# Patient Record
Sex: Male | Born: 1985 | Race: White | Hispanic: No | Marital: Married | Smoking: Former smoker
Health system: Southern US, Community
[De-identification: ages and names within clinical notes are randomized; demographics above are authoritative.]

## PROBLEM LIST (undated history)

## (undated) DIAGNOSIS — R011 Cardiac murmur, unspecified: Secondary | ICD-10-CM

## (undated) HISTORY — PX: HERNIA REPAIR: SHX51

## (undated) HISTORY — PX: CIRCUMCISION: SUR203

---
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
## 2006-04-12 ENCOUNTER — Emergency Department (HOSPITAL_COMMUNITY): Admission: EM | Admit: 2006-04-12 | Discharge: 2006-04-12 | Payer: Self-pay | Admitting: Emergency Medicine

## 2006-04-26 ENCOUNTER — Emergency Department (HOSPITAL_COMMUNITY): Admission: EM | Admit: 2006-04-26 | Discharge: 2006-04-26 | Payer: Self-pay | Admitting: Emergency Medicine

## 2007-09-12 ENCOUNTER — Observation Stay (HOSPITAL_COMMUNITY)
Admission: AD | Admit: 2007-09-12 | Discharge: 2007-09-13 | Payer: Self-pay | Admitting: Thoracic Surgery (Cardiothoracic Vascular Surgery)

## 2007-09-12 ENCOUNTER — Encounter: Payer: Self-pay | Admitting: Emergency Medicine

## 2007-09-12 ENCOUNTER — Ambulatory Visit: Payer: Self-pay | Admitting: Thoracic Surgery (Cardiothoracic Vascular Surgery)

## 2007-09-19 ENCOUNTER — Encounter
Admission: RE | Admit: 2007-09-19 | Discharge: 2007-09-19 | Payer: Self-pay | Admitting: Thoracic Surgery (Cardiothoracic Vascular Surgery)

## 2007-09-19 ENCOUNTER — Ambulatory Visit: Payer: Self-pay | Admitting: Thoracic Surgery (Cardiothoracic Vascular Surgery)

## 2007-12-14 ENCOUNTER — Emergency Department (HOSPITAL_BASED_OUTPATIENT_CLINIC_OR_DEPARTMENT_OTHER): Admission: EM | Admit: 2007-12-14 | Discharge: 2007-12-15 | Payer: Self-pay | Admitting: Emergency Medicine

## 2009-12-08 ENCOUNTER — Emergency Department (HOSPITAL_BASED_OUTPATIENT_CLINIC_OR_DEPARTMENT_OTHER): Admission: EM | Admit: 2009-12-08 | Discharge: 2009-12-08 | Payer: Self-pay | Admitting: Emergency Medicine

## 2009-12-08 ENCOUNTER — Ambulatory Visit: Payer: Self-pay | Admitting: Diagnostic Radiology

## 2009-12-16 ENCOUNTER — Emergency Department (HOSPITAL_BASED_OUTPATIENT_CLINIC_OR_DEPARTMENT_OTHER): Admission: EM | Admit: 2009-12-16 | Discharge: 2009-12-16 | Payer: Self-pay | Admitting: Emergency Medicine

## 2010-01-10 ENCOUNTER — Emergency Department (HOSPITAL_BASED_OUTPATIENT_CLINIC_OR_DEPARTMENT_OTHER): Admission: EM | Admit: 2010-01-10 | Discharge: 2010-01-10 | Payer: Self-pay | Admitting: Emergency Medicine

## 2010-01-21 ENCOUNTER — Emergency Department (HOSPITAL_BASED_OUTPATIENT_CLINIC_OR_DEPARTMENT_OTHER): Admission: EM | Admit: 2010-01-21 | Discharge: 2010-01-21 | Payer: Self-pay | Admitting: Emergency Medicine

## 2010-04-17 ENCOUNTER — Ambulatory Visit
Admission: RE | Admit: 2010-04-17 | Discharge: 2010-04-17 | Payer: Self-pay | Source: Home / Self Care | Attending: Family Medicine | Admitting: Family Medicine

## 2010-07-28 ENCOUNTER — Emergency Department (HOSPITAL_COMMUNITY)
Admission: EM | Admit: 2010-07-28 | Discharge: 2010-07-29 | Disposition: A | Discharge status: Home / Self Care | Payer: Self-pay | Attending: Emergency Medicine | Admitting: Emergency Medicine

## 2010-07-28 DIAGNOSIS — M545 Low back pain, unspecified: Secondary | ICD-10-CM | POA: Insufficient documentation

## 2010-07-28 DIAGNOSIS — IMO0002 Reserved for concepts with insufficient information to code with codable children: Secondary | ICD-10-CM | POA: Insufficient documentation

## 2010-07-29 ENCOUNTER — Telehealth: Payer: Self-pay

## 2010-07-29 NOTE — Telephone Encounter (Signed)
Message copied by Winfield Rast on Tue Jul 29, 2010  1:25 PM ------      Message from: Sharlot Gowda      Created: Tue Jul 29, 2010 12:46 PM       Have patient follow up with me

## 2010-07-29 NOTE — Telephone Encounter (Signed)
Called pt told pt to call back and set up appt to follow up with Dr. Susann Givens

## 2010-07-31 ENCOUNTER — Ambulatory Visit: Payer: Self-pay | Admitting: Family Medicine

## 2010-08-05 NOTE — H&P (Signed)
NAME:  Ricky Robertson, Ricky Robertson                ACCOUNT NO.:  [id number]   MEDICAL RECORD NO.:  [id number]          PATIENT TYPE:  INP   LOCATION:  2014                         FACILITY:  [facility]   PHYSICIAN:  Salvatore Decent. Cornelius Moras, M.D. DATE OF BIRTH:  1985-07-10   DATE OF ADMISSION:  09/12/2007  DATE OF DISCHARGE:                              HISTORY & PHYSICAL   HISTORY OF PRESENT ILLNESS:  This is a previously healthy 25 year old  Caucasian male who was transferred from [facility] to [facility] with  the complaint of shortness of breath over the last couple of weeks.  Yesterday, he was at a softball game and he slid hard into a second  base.  He then experienced a sudden onset of pain that included the  anterior chest area, bilateral neck, and seem to worsen with deep  inspiration.  The patient denies any history of tobacco use.  He has  never had a previous spontaneous pneumothorax or any other history of  trauma.  The patient had both chest x-ray and CT of the chest done at  [facility].  Chest x-ray revealed a tiny right apical pneumothorax with  pneumomediastinum with air dissecting up in the neck. CT of the chest  revealed pneumomediastinum with the air dissecting up in the neck,  however, no pneumothorax.  The patient remained hemodynamically stable  and as previously stated was transferred from [facility] to [facility].   PAST MEDICAL HISTORY:  Negative except the patient was told he had a  heart murmur.   PAST SURGICAL HISTORY:  Denies.   ALLERGIES:  No known drug allergies.   MEDICATIONS:  Database, denies.   FAMILY HISTORY:  Mother and father alive.  Father has a history of OSA.   REVIEW OF SYMPTOMS:  As stated in the HPI.  Dyspnea.  Denies any  hemoptysis, cough, fever, chills, or night sweats.  Denies any abdominal  pain, nauseous, vomiting, emesis, melena, or hematochezia.  MUSCULOSKELETAL:  Positive for bilateral neck pain.  NEURO:  Denies any  blurry or double vision.   Remaining review of symptoms noncontributory.   PHYSICAL EXAMINATION:  GENERAL:  This is a pleasant 25 year old  Caucasian male in no acute distress, is alert, oriented, and  cooperative.  VITAL SIGNS:  The patient is afebrile.  Heart rate is in 60s to 70s.  Blood pressure is 134/70.  HEENT:  Head is atraumatic and normocephalic.  Eyes:  Extraocular muscle  intact.  Pupils equal, round, and reactive to light and accommodation.  Sclerae nonicteric.  NECK:  Supple.  No JVD.  No subcutaneous emphysema.  CARDIOVASCULAR:  Regular rate and rhythm.  S1 and S2 without any  murmurs, gallops, or rubs.  LUNGS:  Clear to auscultation bilaterally.  No rales, wheezes, or  rhonchi.  ABDOMEN:  Soft and nontender.  Bowel sounds present throughout.  No  rebound or guarding.  No CVA tenderness.  EXTREMITIES:  No cyanosis, clubbing, or edema.  Palpable dorsalis pedis  and posterior tibialis bilaterally.  NEUROLOGICAL:  Cranial nerves II through XII grossly intact without any  focal deficits.  PERTINENT IMAGING STUDIES:  Have already been discussed in the HPI.   PERTINENT LABORATORY STUDIES:  White count 14,300, H and H 14.4 and 42.3  respectively, and platelet count of 172,000.  Chem-8, potassium 3.6, BUN  and creatinine of 15 and 1.2 respectively.   IMPRESSION AND PLAN:  Traumatic pneumomediastinum.  The patient will be  admitted for observation.  A repeat chest x-ray will be obtained in the  a.m.      Doree Fudge, PA      [place] H. Cornelius Moras, M.D.  Electronically Signed    DZ/MEDQ  D:  09/12/2007  T:  09/12/2007  Job:  [id number]

## 2010-08-05 NOTE — Assessment & Plan Note (Signed)
OFFICE VISIT   Ricky Robertson, Ricky Robertson  DOB:  March 03, 1986                                        September 19, 2007  CHART #:  [id number]   HISTORY:  The patient is seen in office visit followup following a  recent hospitalization for a traumatic pneumomediastinum.  On the day  prior to his admission, while playing a softball, he slid hard into  second base.  He developed a sudden onset of pain that included the  anterior chest, bilateral neck, and seemed to be worse with deep  inspiration.  He presented to the Emergency Department where he  underwent a chest x-ray and CT scan of the chest.  This revealed a tiny  right apical pneumothorax with pneumomediastinum with air dissecting up  in to the neck.  The CT of the chest revealed pneumomediastinum with the  air dissecting up to the neck, however, no pneumothorax.  He remained  hemodynamically stable and was admitted for observation overnight.  He  was discharged with improvement in the overall chest x-ray appearance as  well as his symptoms.  On today's date, he reports he continues to make  good progress.  He has occasional mild dysphagia, occasional mild  shortness of breath.   CHEST X-RAY:  Chest x-ray was reviewed on today's date.  It reveals no  further evidence of pneumomediastinum or pneumothorax.  There are no new  findings.   PHYSICAL EXAMINATION:  VITAL SIGNS:  Blood pressure is 144/74, pulse is  53, respirations 18, and oxygen saturation is 98%.  GENERAL APPEARANCE:  This is a well-developed young adult male, in no  acute distress.  PULMONARY:  Reveals clear breath sounds throughout.  CARDIAC:  Regular rate and rhythm.  Normal S1 and S2 without murmurs,  gallops, or rubs.   ASSESSMENT:  The patient has made good progress following his traumatic  pneumomediastinum.  There are no new surgical issues related to this.  He is encouraged to follow up with his family physician as related to  some previous issues  that he has questioned,  specifically the possibility that he may have some sleep apnea.  He  understands that he will no longer need to be followed from a thoracic  surgical view point.   Rowe Clack, P.A.-C.   Sherryll Burger  D:  09/19/2007  T:  09/19/2007  Job:  [id number]   cc:   Salvatore Decent. Cornelius Moras, M.D.

## 2010-08-08 NOTE — Discharge Summary (Signed)
NAME:  Ricky Robertson, Ricky Robertson                ACCOUNT NO.:  [id number]   MEDICAL RECORD NO.:  [id number]          PATIENT TYPE:  OBV   LOCATION:  2014                         FACILITY:  [facility]   PHYSICIAN:  Salvatore Decent. Cornelius Moras, M.D. DATE OF BIRTH:  [birth year]   DATE OF ADMISSION:  09/12/2007  DATE OF DISCHARGE:  09/13/2007                               DISCHARGE SUMMARY   Mr. Ricky Robertson is a 25 year old Caucasian male who was transferred from  [facility] to [facility] with complaints of shortness of  breath over the previous couple weeks.  On the day prior to admission at  a softball game, he slipped hard into second base.  He then experienced  a sudden onset of pain included his anterior chest, bilateral neck, and  seemed to worse with deep inspiration.  The patient denied any history  of tobacco use.  The patient never had a history of spontaneous  pneumothorax or other history of trauma.  The patient had both the chest  x-ray and a CT scan at [facility].  The chest x-ray revealed a tiny  right apical pneumothorax with pneumomediastinum with air dissecting up  to the neck.  CT of the chest revealed pneumomediastinum with the air  dissecting up to the neck; however, no pneumothorax.  The patient was  hemodynamically stable and transferred to [facility].   PAST MEDICAL HISTORY:  Negative except he was told he had a heart  murmur.   PAST SURGICAL HISTORY:  Unremarkable.   ALLERGIES:  No known.   MEDICATIONS:  Prior to admission, none.   FAMILY HISTORY:  Please see the history and physical done at the time of  admission.   SOCIAL HISTORY:  Please see the history and physical done at the time of  admission.   REVIEW OF SYSTEMS:  Please see the history and physical done at the time  of admission.   PHYSICAL EXAMINATION:  Please see the history and physical done at the  time of admission.   HOSPITAL COURSE:  The patient was admitted with traumatic  pneumomediastinum.  He was  admitted for overnight observation.  Repeat  chest x-ray showed some improvement with no air in the neck on September 13, 2007.  Additionally, he had a Gastrografin swallow.  This revealed only  a small hiatal hernia with no esophageal leakage.  He was deemed to be  acceptable for discharge on that date as he was otherwise quite stable.   MEDICATIONS ON DISCHARGE:  None.   INSTRUCTIONS:  The patient received written instructions regarding  medications, activity, diet, wound care, and followup.  Followup include  Dr. Cornelius Moras on 1 week post-discharge.   MEDICATIONS ON DISCHARGE:  Tylenol one every 4-6 hours as needed for  pain.  Of note, the patient was also instructed to follow up with his  family physician about any questionable history of sleep apnea.  He  understood this.   FINAL DIAGNOSIS:  Traumatic pneumomediastinum.   OTHER DIAGNOSIS:  Possible history of sleep apnea.      Rowe Clack, P.A.-C.  Salvatore Decent. Cornelius Moras, M.D.  Electronically Signed    WEG/MEDQ  D:  11/17/2007  T:  11/18/2007  Job:  [id number]

## 2010-09-18 ENCOUNTER — Emergency Department (HOSPITAL_COMMUNITY): Payer: Self-pay

## 2010-09-18 ENCOUNTER — Emergency Department (HOSPITAL_COMMUNITY)
Admission: EM | Admit: 2010-09-18 | Discharge: 2010-09-18 | Disposition: A | Discharge status: Home / Self Care | Payer: Self-pay | Attending: Emergency Medicine | Admitting: Emergency Medicine

## 2010-09-18 DIAGNOSIS — R51 Headache: Secondary | ICD-10-CM | POA: Insufficient documentation

## 2010-09-18 DIAGNOSIS — H5710 Ocular pain, unspecified eye: Secondary | ICD-10-CM | POA: Insufficient documentation

## 2010-09-18 DIAGNOSIS — S0510XA Contusion of eyeball and orbital tissues, unspecified eye, initial encounter: Secondary | ICD-10-CM | POA: Insufficient documentation

## 2010-09-18 DIAGNOSIS — R6884 Jaw pain: Secondary | ICD-10-CM | POA: Insufficient documentation

## 2010-09-18 DIAGNOSIS — S0083XA Contusion of other part of head, initial encounter: Secondary | ICD-10-CM | POA: Insufficient documentation

## 2010-09-18 DIAGNOSIS — S0280XA Fracture of other specified skull and facial bones, unspecified side, initial encounter for closed fracture: Secondary | ICD-10-CM | POA: Insufficient documentation

## 2010-09-18 DIAGNOSIS — S0003XA Contusion of scalp, initial encounter: Secondary | ICD-10-CM | POA: Insufficient documentation

## 2010-09-18 DIAGNOSIS — IMO0002 Reserved for concepts with insufficient information to code with codable children: Secondary | ICD-10-CM | POA: Insufficient documentation

## 2010-12-18 LAB — DIFFERENTIAL
Basophils Absolute: 0
Basophils Relative: 0
Eosinophils Absolute: 0.3
Monocytes Relative: 10
Neutro Abs: 9 — ABNORMAL HIGH
Neutrophils Relative %: 63

## 2010-12-18 LAB — CBC
MCHC: 34
MCV: 90
Platelets: 172
RBC: 4.7
WBC: 14.3 — ABNORMAL HIGH

## 2010-12-18 LAB — POCT I-STAT, CHEM 8
Chloride: 105
Glucose, Bld: 83
HCT: 44
Hemoglobin: 15
Potassium: 3.6
Sodium: 140

## 2010-12-22 LAB — URINALYSIS, ROUTINE W REFLEX MICROSCOPIC
Leukocytes, UA: NEGATIVE
Nitrite: NEGATIVE
Specific Gravity, Urine: 1.04 — ABNORMAL HIGH
Urobilinogen, UA: 1
pH: 6

## 2010-12-22 LAB — URINE MICROSCOPIC-ADD ON

## 2011-01-25 ENCOUNTER — Emergency Department (HOSPITAL_BASED_OUTPATIENT_CLINIC_OR_DEPARTMENT_OTHER)
Admission: EM | Admit: 2011-01-25 | Discharge: 2011-01-25 | Disposition: A | Discharge status: Home / Self Care | Payer: Self-pay | Attending: Emergency Medicine | Admitting: Emergency Medicine

## 2011-01-25 ENCOUNTER — Encounter: Payer: Self-pay | Admitting: *Deleted

## 2011-01-25 DIAGNOSIS — L0201 Cutaneous abscess of face: Secondary | ICD-10-CM | POA: Insufficient documentation

## 2011-01-25 DIAGNOSIS — F17200 Nicotine dependence, unspecified, uncomplicated: Secondary | ICD-10-CM | POA: Insufficient documentation

## 2011-01-25 DIAGNOSIS — L03211 Cellulitis of face: Secondary | ICD-10-CM | POA: Insufficient documentation

## 2011-01-25 HISTORY — DX: Cardiac murmur, unspecified: R01.1

## 2011-01-25 MED ORDER — LIDOCAINE HCL 2 % IJ SOLN
INTRAMUSCULAR | Status: AC
  Filled 2011-01-25: qty 1

## 2011-01-25 MED ORDER — HYDROCODONE-ACETAMINOPHEN 5-325 MG PO TABS: 2.0000 | ORAL_TABLET | ORAL | Status: AC | PRN

## 2011-01-25 MED ORDER — DOXYCYCLINE HYCLATE 100 MG PO CAPS: 100.0000 mg | ORAL_CAPSULE | Freq: Two times a day (BID) | ORAL | Status: AC

## 2011-01-25 MED ORDER — LIDOCAINE HCL 2 % IJ SOLN
10.0000 mL | Freq: Once | INTRAMUSCULAR | Status: AC
  Administered 2011-01-25: 200 mg via INTRADERMAL

## 2011-01-25 MED ORDER — LIDOCAINE HCL 2 % IJ SOLN: 20.0000 mL | Freq: Once | INTRAMUSCULAR | Status: DC

## 2011-01-25 NOTE — ED Notes (Signed)
While assessing pt, he was noted to wheeze when taking a deep breath. When questioned, he stated that is why he is going to quit smoking.

## 2011-01-25 NOTE — ED Notes (Signed)
Pt states he has had this knot on the side of his face for about 4 years. It is normally 1/4 " in diameter, but on Halloween he noticed it started getting bigger. No pain at present.

## 2011-01-25 NOTE — ED Provider Notes (Signed)
History     CSN: [id number] Arrival date & time: 01/25/2011  8:14 PM   First MD Initiated Contact with Patient 01/25/11 2029      Chief Complaint  Patient presents with  . Cyst    (Consider location/radiation/quality/duration/timing/severity/associated sxs/prior treatment) Patient is a 25 y.o. male presenting with abscess. The history is provided by the patient. No language interpreter was used.  Abscess  This is a new problem. The current episode started more than one week ago. The problem occurs frequently. The problem has been gradually worsening. The abscess is present on the face. The abscess is characterized by painfulness and redness. The abscess first occurred at home. There were no sick contacts. He has received no recent medical care.  Pt reports he has had a swollen area on the right side of his face for about 4 years.  Pt reports area was the size of a pea,  Now increased swelling and pain  Past Medical History  Diagnosis Date  . Heart murmur     Past Surgical History  Procedure Date  . Circumcision   . Hernia repair     History reviewed. No pertinent family history.  History  Substance Use Topics  . Smoking status: Current Some Day Smoker  . Smokeless tobacco: Not on file  . Alcohol Use: Yes      Review of Systems  HENT: Positive for facial swelling.   All other systems reviewed and are negative.    Allergies  Review of patient's allergies indicates no known allergies.  Home Medications  No current outpatient prescriptions on file.  BP 143/79  Pulse 60  Temp(Src) 98.2 F (36.8 C) (Oral)  Resp 18  Ht 5\' 11"  (1.803 m)  Wt 200 lb (90.719 kg)  BMI 27.89 kg/m2  SpO2 98%  Physical Exam  Nursing note and vitals reviewed. Constitutional: He appears well-developed and well-nourished.  HENT:  Head: Normocephalic and atraumatic.  Eyes: Conjunctivae and EOM are normal. Pupils are equal, round, and reactive to light.  Neck: Normal range of motion.  Neck supple.  Cardiovascular: Normal rate.   Pulmonary/Chest: Effort normal.  Musculoskeletal: Normal range of motion.  Neurological: He is alert.  Skin: There is erythema.       2 cm erythematous area fluctuant  Psychiatric: He has a normal mood and affect.    ED Course  INCISION AND DRAINAGE Date/Time: 01/25/2011 9:25 PM Performed by: Langston Masker Authorized by: Langston Masker Consent: Verbal consent obtained. Risks and benefits: risks, benefits and alternatives were discussed Consent given by: patient Patient understanding: patient states understanding of the procedure being performed Patient identity confirmed: verbally with patient Time out: Immediately prior to procedure a "time out" was called to verify the correct patient, procedure, equipment, support staff and site/side marked as required. Type: abscess Body area: head/neck Location details: face Anesthesia: local infiltration Local anesthetic: lidocaine 2% without epinephrine Anesthetic total: 3 ml Scalpel size: 11 Incision type: single straight Complexity: simple Drainage: purulent Drainage amount: moderate Wound treatment: wound left open Patient tolerance: Patient tolerated the procedure well with no immediate complications. Comments: Cyst material expressed,  Purulent drainage,     (including critical care time)   Labs Reviewed  CULTURE, ROUTINE-ABSCESS   No results found.   No diagnosis found.    MDM          Langston Masker, [place] 01/25/11 2126

## 2011-01-26 NOTE — ED Provider Notes (Signed)
Medical screening examination/treatment/procedure(s) were performed by non-physician practitioner and as supervising physician I was immediately available for consultation/collaboration.   Orlandria Kissner T Jocelyn Nold, MD 01/26/11 0543 

## 2011-01-28 LAB — CULTURE, ROUTINE-ABSCESS

## 2011-01-29 NOTE — ED Notes (Signed)
+   wound culture. Chart sent to EDP office for review 

## 2011-02-03 NOTE — ED Notes (Signed)
No further antibiotics necessary.Follow up as previously treated.

## 2011-02-05 NOTE — ED Notes (Signed)
Per Trixie Dredge.

## 2012-03-17 ENCOUNTER — Encounter (HOSPITAL_COMMUNITY): Payer: Self-pay | Admitting: *Deleted

## 2012-03-17 ENCOUNTER — Emergency Department (HOSPITAL_COMMUNITY)
Admission: EM | Admit: 2012-03-17 | Discharge: 2012-03-18 | Disposition: A | Discharge status: Home / Self Care | Payer: BC Managed Care – PPO | Attending: Emergency Medicine | Admitting: Emergency Medicine

## 2012-03-17 DIAGNOSIS — F17200 Nicotine dependence, unspecified, uncomplicated: Secondary | ICD-10-CM | POA: Insufficient documentation

## 2012-03-17 DIAGNOSIS — R011 Cardiac murmur, unspecified: Secondary | ICD-10-CM | POA: Insufficient documentation

## 2012-03-17 DIAGNOSIS — K2970 Gastritis, unspecified, without bleeding: Secondary | ICD-10-CM | POA: Insufficient documentation

## 2012-03-17 DIAGNOSIS — K2990 Gastroduodenitis, unspecified, without bleeding: Secondary | ICD-10-CM | POA: Insufficient documentation

## 2012-03-17 DIAGNOSIS — Z79899 Other long term (current) drug therapy: Secondary | ICD-10-CM | POA: Insufficient documentation

## 2012-03-17 DIAGNOSIS — R109 Unspecified abdominal pain: Secondary | ICD-10-CM

## 2012-03-17 DIAGNOSIS — R1084 Generalized abdominal pain: Secondary | ICD-10-CM | POA: Insufficient documentation

## 2012-03-17 LAB — CBC WITH DIFFERENTIAL/PLATELET
Basophils Relative: 0 % (ref 0–1)
Eosinophils Absolute: 0.2 10*3/uL (ref 0.0–0.7)
Lymphs Abs: 3 10*3/uL (ref 0.7–4.0)
MCH: 30.1 pg (ref 26.0–34.0)
MCHC: 34.7 g/dL (ref 30.0–36.0)
Neutro Abs: 6.8 10*3/uL (ref 1.7–7.7)
Neutrophils Relative %: 62 % (ref 43–77)
Platelets: 186 10*3/uL (ref 150–400)
RBC: 5.11 MIL/uL (ref 4.22–5.81)

## 2012-03-17 LAB — URINALYSIS, ROUTINE W REFLEX MICROSCOPIC
Bilirubin Urine: NEGATIVE
Leukocytes, UA: NEGATIVE
Nitrite: NEGATIVE
Specific Gravity, Urine: 1.02 (ref 1.005–1.030)
Urobilinogen, UA: 1 mg/dL (ref 0.0–1.0)
pH: 6 (ref 5.0–8.0)

## 2012-03-17 LAB — COMPREHENSIVE METABOLIC PANEL
ALT: 22 U/L (ref 0–53)
AST: 19 U/L (ref 0–37)
Albumin: 4.2 g/dL (ref 3.5–5.2)
Alkaline Phosphatase: 76 U/L (ref 39–117)
Chloride: 98 mEq/L (ref 96–112)
Potassium: 4 mEq/L (ref 3.5–5.1)
Sodium: 134 mEq/L — ABNORMAL LOW (ref 135–145)
Total Protein: 7.6 g/dL (ref 6.0–8.3)

## 2012-03-17 MED ORDER — SODIUM CHLORIDE 0.9 % IV SOLN
1000.0000 mL | Freq: Once | INTRAVENOUS | Status: AC
  Administered 2012-03-17: 1000 mL via INTRAVENOUS

## 2012-03-17 MED ORDER — SODIUM CHLORIDE 0.9 % IV SOLN
1000.0000 mL | INTRAVENOUS | Status: DC
  Administered 2012-03-18: 1000 mL via INTRAVENOUS

## 2012-03-17 MED ORDER — ONDANSETRON HCL 4 MG/2ML IJ SOLN
4.0000 mg | Freq: Once | INTRAMUSCULAR | Status: AC
  Administered 2012-03-17: 4 mg via INTRAVENOUS
  Filled 2012-03-17: qty 2

## 2012-03-17 MED ORDER — FAMOTIDINE IN NACL 20-0.9 MG/50ML-% IV SOLN
20.0000 mg | Freq: Once | INTRAVENOUS | Status: AC
  Administered 2012-03-17: 20 mg via INTRAVENOUS
  Filled 2012-03-17: qty 50

## 2012-03-17 MED ORDER — MORPHINE SULFATE 4 MG/ML IJ SOLN
4.0000 mg | Freq: Once | INTRAMUSCULAR | Status: AC
  Administered 2012-03-17: 4 mg via INTRAVENOUS
  Filled 2012-03-17: qty 1

## 2012-03-17 NOTE — ED Notes (Signed)
Pt c/o abd pain x 2 days; nausea no vomiting; diarrhea x 1; left flank pain

## 2012-03-18 ENCOUNTER — Emergency Department (HOSPITAL_COMMUNITY): Payer: BC Managed Care – PPO

## 2012-03-18 MED ORDER — OMEPRAZOLE 20 MG PO CPDR: DELAYED_RELEASE_CAPSULE | ORAL | Status: DC

## 2012-03-18 MED ORDER — IOHEXOL 300 MG/ML  SOLN
100.0000 mL | Freq: Once | INTRAMUSCULAR | Status: AC | PRN
  Administered 2012-03-18: 100 mL via INTRAVENOUS

## 2012-03-18 MED ORDER — MORPHINE SULFATE 4 MG/ML IJ SOLN
4.0000 mg | Freq: Once | INTRAMUSCULAR | Status: AC
  Administered 2012-03-18: 4 mg via INTRAVENOUS
  Filled 2012-03-18: qty 1

## 2012-03-18 MED ORDER — TRAMADOL-ACETAMINOPHEN 37.5-325 MG PO TABS: ORAL_TABLET | ORAL | Status: DC

## 2012-03-18 NOTE — ED Provider Notes (Signed)
History     CSN: [id number]  Arrival date & time 03/17/12  2117   First MD Initiated Contact with Patient 03/17/12 2304      No chief complaint on file.   (Consider location/radiation/quality/duration/timing/severity/associated sxs/prior treatment) HPI  Patient reports he started having diffuse abdominal pain 2 days ago that radiates into his lower back. He cannot characterize the pain except to say "it's a pain". At some point he does state it is a burning discomfort. He denies nausea, vomiting or fever. He did have diarrhea once today. He denies urinary symptoms such as dysuria or frequency. He states if he eats the pain gets worse and causes more burning in his abdomen. He states nothing makes the pain feel worse. He states the pain is constant but waxes and wanes. He denies taking any over-the-counter medications. He states he's never had this pain before and he's never had any abdominal surgery. He states today he was gagging trying to make himself vomit thinking that might make his pain better and he saw some streaks of blood twice in his saliva.  PCP Dr. Susann Givens  Past Medical History  Diagnosis Date  . Heart murmur     Past Surgical History  Procedure Date  . Circumcision   . Hernia repair     No family history on file.  History  Substance Use Topics  . Smoking status: Current Some Day Smoker 1 pack per day   . Smokeless tobacco: Not on file  . Alcohol Use: Yes  Patient had 2 beers Christmas Eve Employed    Review of Systems  All other systems reviewed and are negative.    Allergies  Review of patient's allergies indicates no known allergies.  Home Medications   Current Outpatient Rx  Name  Route  Sig  Dispense  Refill  . OMEPRAZOLE 20 MG PO CPDR      Take 1 or 2 po Q 6hrs for pain   60 capsule   0   . TRAMADOL-ACETAMINOPHEN 37.5-325 MG PO TABS      2 tabs po QID prn pain   16 tablet   0     BP 127/94  Pulse 64  Temp 97.9 F (36.6 C)  (Oral)  Resp 20  SpO2 98%  Vital signs normal    Physical Exam  Nursing note and vitals reviewed. Constitutional: He is oriented to person, place, and time. He appears well-developed and well-nourished.  Non-toxic appearance. He does not appear ill. No distress.  HENT:  Head: Normocephalic and atraumatic.  Right Ear: External ear normal.  Left Ear: External ear normal.  Nose: Nose normal. No mucosal edema or rhinorrhea.  Mouth/Throat: Oropharynx is clear and moist and mucous membranes are normal. No dental abscesses or uvula swelling.  Eyes: Conjunctivae normal and EOM are normal. Pupils are equal, round, and reactive to light.  Neck: Normal range of motion and full passive range of motion without pain. Neck supple.  Cardiovascular: Normal rate, regular rhythm and normal heart sounds.  Exam reveals no gallop and no friction rub.   No murmur heard. Pulmonary/Chest: Effort normal and breath sounds normal. No respiratory distress. He has no wheezes. He has no rhonchi. He has no rales. He exhibits no tenderness and no crepitus.  Abdominal: Soft. Normal appearance and bowel sounds are normal. He exhibits no distension. There is no tenderness. There is no rebound and no guarding.       Active bowel sounds Nontender to palpation he states that  the pain is deeper and is not made worse with direct palpation  Musculoskeletal: Normal range of motion. He exhibits no edema and no tenderness.       Moves all extremities well.   Patient indicates his back pain is in his lower right sacral area  Neurological: He is alert and oriented to person, place, and time. He has normal strength. No cranial nerve deficit.  Skin: Skin is warm, dry and intact. No rash noted. No erythema. No pallor.  Psychiatric: He has a normal mood and affect. His speech is normal and behavior is normal. His mood appears not anxious.    ED Course  Procedures (including critical care time)   Medications  0.9 %  sodium  chloride infusion (0 mL Intravenous Stopped 03/18/12 0056)    Followed by  0.9 %  sodium chloride infusion (1000 mL Intravenous New Bag/Given 03/18/12 0100)  morphine 4 MG/ML injection 4 mg (4 mg Intravenous Given 03/17/12 2357)  ondansetron (ZOFRAN) injection 4 mg (4 mg Intravenous Given 03/17/12 2357)  famotidine (PEPCID) IVPB 20 mg (0 mg Intravenous Stopped 03/18/12 0056)  morphine 4 MG/ML injection 4 mg (4 mg Intravenous Given 03/18/12 0059)  iohexol (OMNIPAQUE) 300 MG/ML solution 100 mL (100 mL Intravenous Contrast Given 03/18/12 0233)   Recheck 04:30 pt sleeping in NAD.   Results for orders placed during the hospital encounter of 03/17/12  URINALYSIS, ROUTINE W REFLEX MICROSCOPIC      Component Value Range   Color, Urine YELLOW  YELLOW   APPearance CLEAR  CLEAR   Specific Gravity, Urine 1.020  1.005 - 1.030   pH 6.0  5.0 - 8.0   Glucose, UA NEGATIVE  NEGATIVE mg/dL   Hgb urine dipstick NEGATIVE  NEGATIVE   Bilirubin Urine NEGATIVE  NEGATIVE   Ketones, ur NEGATIVE  NEGATIVE mg/dL   Protein, ur NEGATIVE  NEGATIVE mg/dL   Urobilinogen, UA 1.0  0.0 - 1.0 mg/dL   Nitrite NEGATIVE  NEGATIVE   Leukocytes, UA NEGATIVE  NEGATIVE  CBC WITH DIFFERENTIAL      Component Value Range   WBC 11.0 (*) 4.0 - 10.5 K/uL   RBC 5.11  4.22 - 5.81 MIL/uL   Hemoglobin 15.4  13.0 - 17.0 g/dL   HCT 16.1  09.6 - 04.5 %   MCV 86.9  78.0 - 100.0 fL   MCH 30.1  26.0 - 34.0 pg   MCHC 34.7  30.0 - 36.0 g/dL   RDW 40.9  81.1 - 91.4 %   Platelets 186  150 - 400 K/uL   Neutrophils Relative 62  43 - 77 %   Neutro Abs 6.8  1.7 - 7.7 K/uL   Lymphocytes Relative 27  12 - 46 %   Lymphs Abs 3.0  0.7 - 4.0 K/uL   Monocytes Relative 9  3 - 12 %   Monocytes Absolute 1.0  0.1 - 1.0 K/uL   Eosinophils Relative 2  0 - 5 %   Eosinophils Absolute 0.2  0.0 - 0.7 K/uL   Basophils Relative 0  0 - 1 %   Basophils Absolute 0.0  0.0 - 0.1 K/uL  COMPREHENSIVE METABOLIC PANEL      Component Value Range   Sodium 134 (*)  135 - 145 mEq/L   Potassium 4.0  3.5 - 5.1 mEq/L   Chloride 98  96 - 112 mEq/L   CO2 27  19 - 32 mEq/L   Glucose, Bld 98  70 - 99 mg/dL   BUN  16  6 - 23 mg/dL   Creatinine, Ser 1.61  0.50 - 1.35 mg/dL   Calcium 9.6  8.4 - 09.6 mg/dL   Total Protein 7.6  6.0 - 8.3 g/dL   Albumin 4.2  3.5 - 5.2 g/dL   AST 19  0 - 37 U/L   ALT 22  0 - 53 U/L   Alkaline Phosphatase 76  39 - 117 U/L   Total Bilirubin 0.6  0.3 - 1.2 mg/dL   GFR calc non Af Amer 71 (*) >90 mL/min   GFR calc Af Amer 82 (*) >90 mL/min  LIPASE, BLOOD      Component Value Range   Lipase 24  11 - 59 U/L   Laboratory interpretation all normal except except for leukocytosis    Ct Abdomen Pelvis W Contrast  03/18/2012  *RADIOLOGY REPORT*  Clinical Data: Left-sided flank pain, nausea and diarrhea.  Diffuse abdominal pain.  CT ABDOMEN AND PELVIS WITH CONTRAST  Technique:  Multidetector CT imaging of the abdomen and pelvis was performed following the standard protocol during bolus administration of intravenous contrast.  Contrast: OMNIPAQUE IOHEXOL 300 MG/ML  SOLN  Comparison: None.  Findings: The visualized lung bases are clear.  The liver and spleen are unremarkable in appearance.  The gallbladder is within normal limits.  The pancreas and adrenal glands are unremarkable.  The kidneys are unremarkable in appearance.  There is no evidence of hydronephrosis.  No renal or ureteral stones are seen.  No perinephric stranding is appreciated.  No free fluid is identified.  The small bowel is unremarkable in appearance.  The stomach is within normal limits.  No acute vascular abnormalities are seen.  The appendix is normal in caliber, without evidence for appendicitis.  The colon is unremarkable in appearance.  The bladder is mildly distended and grossly unremarkable.  The prostate remains normal in size.  No inguinal lymphadenopathy is seen.  No acute osseous abnormalities are identified.  Chronic bilateral pars defects are noted at L5,  without evidence of anterolisthesis.  IMPRESSION:  1.  No acute abnormalities seen within the abdomen or pelvis. 2.  Chronic bilateral pars defects at L5, without evidence of anterolisthesis.   Original Report Authenticated By: Tonia Ghent, M.D.      1. Abdominal pain   2. Gastritis    New Prescriptions   OMEPRAZOLE (PRILOSEC) 20 MG CAPSULE    Take 1 or 2 po Q 6hrs for pain   TRAMADOL-ACETAMINOPHEN (ULTRACET) 37.5-325 MG PER TABLET    2 tabs po QID prn pain    Plan discharge  Devoria Albe, MD, Armando Gang    MDM          Ward Givens, MD 03/18/12 [phone]

## 2012-09-29 ENCOUNTER — Emergency Department (HOSPITAL_BASED_OUTPATIENT_CLINIC_OR_DEPARTMENT_OTHER)
Admission: EM | Admit: 2012-09-29 | Discharge: 2012-09-29 | Disposition: A | Discharge status: Home / Self Care | Payer: BC Managed Care – PPO | Attending: Emergency Medicine | Admitting: Emergency Medicine

## 2012-09-29 ENCOUNTER — Emergency Department (HOSPITAL_BASED_OUTPATIENT_CLINIC_OR_DEPARTMENT_OTHER): Payer: BC Managed Care – PPO

## 2012-09-29 ENCOUNTER — Encounter (HOSPITAL_BASED_OUTPATIENT_CLINIC_OR_DEPARTMENT_OTHER): Payer: Self-pay | Admitting: *Deleted

## 2012-09-29 DIAGNOSIS — Y9364 Activity, baseball: Secondary | ICD-10-CM | POA: Insufficient documentation

## 2012-09-29 DIAGNOSIS — Y92838 Other recreation area as the place of occurrence of the external cause: Secondary | ICD-10-CM | POA: Insufficient documentation

## 2012-09-29 DIAGNOSIS — W1809XA Striking against other object with subsequent fall, initial encounter: Secondary | ICD-10-CM | POA: Insufficient documentation

## 2012-09-29 DIAGNOSIS — Z79899 Other long term (current) drug therapy: Secondary | ICD-10-CM | POA: Insufficient documentation

## 2012-09-29 DIAGNOSIS — R0602 Shortness of breath: Secondary | ICD-10-CM | POA: Insufficient documentation

## 2012-09-29 DIAGNOSIS — R011 Cardiac murmur, unspecified: Secondary | ICD-10-CM | POA: Insufficient documentation

## 2012-09-29 DIAGNOSIS — S2341XA Sprain of ribs, initial encounter: Secondary | ICD-10-CM

## 2012-09-29 DIAGNOSIS — R059 Cough, unspecified: Secondary | ICD-10-CM | POA: Insufficient documentation

## 2012-09-29 DIAGNOSIS — R05 Cough: Secondary | ICD-10-CM | POA: Insufficient documentation

## 2012-09-29 DIAGNOSIS — Y9239 Other specified sports and athletic area as the place of occurrence of the external cause: Secondary | ICD-10-CM | POA: Insufficient documentation

## 2012-09-29 DIAGNOSIS — F17200 Nicotine dependence, unspecified, uncomplicated: Secondary | ICD-10-CM | POA: Insufficient documentation

## 2012-09-29 MED ORDER — OXYCODONE-ACETAMINOPHEN 5-325 MG PO TABS: 1.0000 | ORAL_TABLET | ORAL | Status: DC | PRN

## 2012-09-29 NOTE — ED Notes (Signed)
Pt reports playing softball 6/30, dove for ball and landed on left side, pt experienced some pain at time, but has had increased sob since. Pt also reports that he has history of sob, uses son's nebulizer machine at home at times which helps. Pt reports pain now radiates from under left breast to back within last few days

## 2012-09-29 NOTE — ED Notes (Signed)
Pt c/o left rib injury x 5 days ago now c/o SOb and back pain

## 2012-09-30 NOTE — ED Provider Notes (Signed)
History    CSN: [id number] Arrival date & time 09/29/12  4098  First MD Initiated Contact with Patient 09/29/12 1933     Chief Complaint  Patient presents with  . Rib Injury    Patient is a 27 y.o. male presenting with chest pain. The history is provided by the patient.  Chest Pain Pain location:  L chest Pain radiates to:  Upper back Pain severity:  Moderate Duration:  11 days Timing:  Constant Progression:  Worsening Chronicity:  New Relieved by:  None tried Worsened by:  Deep breathing Associated symptoms: cough and shortness of breath   Associated symptoms: no fever and no weakness   pt reports he was playing softball 11 days ago and fell landing on left chest No LOC No head injury No HA No neck pain He reports he has had persistent pain in chest that radiates to back with mild SOB No hemoptysis  Past Medical History  Diagnosis Date  . Heart murmur    Past Surgical History  Procedure Laterality Date  . Circumcision    . Hernia repair     History reviewed. No pertinent family history. History  Substance Use Topics  . Smoking status: Current Some Day Smoker  . Smokeless tobacco: Not on file  . Alcohol Use: Yes    Review of Systems  Constitutional: Negative for fever.  HENT: Negative for neck pain and neck stiffness.   Respiratory: Positive for cough and shortness of breath.   Cardiovascular: Positive for chest pain.  Neurological: Negative for syncope and weakness.  All other systems reviewed and are negative.    Allergies  Review of patient's allergies indicates no known allergies.  Home Medications   Current Outpatient Rx  Name  Route  Sig  Dispense  Refill  . omeprazole (PRILOSEC) 20 MG capsule      Take 1 or 2 po Q 6hrs for pain   60 capsule   0   . oxyCODONE-acetaminophen (PERCOCET/ROXICET) 5-325 MG per tablet   Oral   Take 1 tablet by mouth every 4 (four) hours as needed for pain.   3 tablet   0    BP 137/71  Pulse 72   Temp(Src) 98.2 F (36.8 C) (Oral)  Resp 16  Ht 5\' 11"  (1.803 m)  Wt 210 lb (95.255 kg)  BMI 29.3 kg/m2  SpO2 98% Physical Exam CONSTITUTIONAL: Well developed/well nourished, watching TV HEAD: Normocephalic/atraumatic EYES: EOMI/PERRL ENMT: Mucous membranes moist NECK: supple no meningeal signs SPINE:entire spine nontender, No bruising/crepitance/stepoffs noted to spine CV: S1/S2 noted, no murmurs/rubs/gallops noted Chest - mild tenderness along left chest wall, no crepitance or stepoffs LUNGS: Lungs are clear to auscultation bilaterally, no apparent distress ABDOMEN: soft, nontender, no rebound or guarding GU:no cva tenderness NEURO: Pt is awake/alert, moves all extremitiesx4 EXTREMITIES: pulses normal, full ROM SKIN: warm, color normal PSYCH: no abnormalities of mood noted  ED Course  Procedures (including critical care time) Labs Reviewed - No data to display Dg Ribs Unilateral W/chest Left  09/29/2012   *RADIOLOGY REPORT*  Clinical Data: Posterior rib pain.Baseball injury.  LEFT RIBS AND CHEST - 3+ VIEW  Comparison: 12/14/2007  Findings: Heart and mediastinal contours are within normal limits. No focal opacities or effusions.  No acute bony abnormality.  No visible rib fracture.  No pneumothorax.  IMPRESSION: No active cardiopulmonary disease.   Original Report Authenticated By: Charlett Nose, M.D.   1. Sprain and strain of ribs, initial encounter     MDM  Nursing notes including past medical history and social history reviewed and considered in documentation xrays reviewed and considered   Xray negative Pt in no distress Pain only with palpation/movement, doubt ACS/PE or other cause given pain started after fall No hypoxia.  Stable for d/c  Joya Gaskins, MD 09/30/12 [phone]

## 2014-08-10 ENCOUNTER — Encounter (HOSPITAL_BASED_OUTPATIENT_CLINIC_OR_DEPARTMENT_OTHER): Payer: Self-pay

## 2014-08-10 ENCOUNTER — Emergency Department (HOSPITAL_BASED_OUTPATIENT_CLINIC_OR_DEPARTMENT_OTHER): Payer: BLUE CROSS/BLUE SHIELD

## 2014-08-10 ENCOUNTER — Emergency Department (HOSPITAL_BASED_OUTPATIENT_CLINIC_OR_DEPARTMENT_OTHER)
Admission: EM | Admit: 2014-08-10 | Discharge: 2014-08-10 | Disposition: A | Discharge status: Home / Self Care | Payer: BLUE CROSS/BLUE SHIELD | Attending: Emergency Medicine | Admitting: Emergency Medicine

## 2014-08-10 DIAGNOSIS — Z720 Tobacco use: Secondary | ICD-10-CM | POA: Insufficient documentation

## 2014-08-10 DIAGNOSIS — S9002XA Contusion of left ankle, initial encounter: Secondary | ICD-10-CM | POA: Diagnosis not present

## 2014-08-10 DIAGNOSIS — Y998 Other external cause status: Secondary | ICD-10-CM | POA: Insufficient documentation

## 2014-08-10 DIAGNOSIS — W208XXA Other cause of strike by thrown, projected or falling object, initial encounter: Secondary | ICD-10-CM | POA: Diagnosis not present

## 2014-08-10 DIAGNOSIS — Y9289 Other specified places as the place of occurrence of the external cause: Secondary | ICD-10-CM | POA: Insufficient documentation

## 2014-08-10 DIAGNOSIS — Z79899 Other long term (current) drug therapy: Secondary | ICD-10-CM | POA: Insufficient documentation

## 2014-08-10 DIAGNOSIS — R011 Cardiac murmur, unspecified: Secondary | ICD-10-CM | POA: Diagnosis not present

## 2014-08-10 DIAGNOSIS — S9001XA Contusion of right ankle, initial encounter: Secondary | ICD-10-CM

## 2014-08-10 DIAGNOSIS — S99912A Unspecified injury of left ankle, initial encounter: Secondary | ICD-10-CM | POA: Diagnosis present

## 2014-08-10 DIAGNOSIS — Y9389 Activity, other specified: Secondary | ICD-10-CM | POA: Insufficient documentation

## 2014-08-10 MED ORDER — IBUPROFEN 400 MG PO TABS
400.0000 mg | ORAL_TABLET | Freq: Once | ORAL | Status: AC
  Administered 2014-08-10: 400 mg via ORAL
  Filled 2014-08-10: qty 1

## 2014-08-10 MED ORDER — OXYCODONE-ACETAMINOPHEN 5-325 MG PO TABS
1.0000 | ORAL_TABLET | Freq: Once | ORAL | Status: AC
  Administered 2014-08-10: 1 via ORAL
  Filled 2014-08-10: qty 1

## 2014-08-10 MED ORDER — OXYCODONE-ACETAMINOPHEN 5-325 MG PO TABS: ORAL_TABLET | ORAL | Status: DC

## 2014-08-10 NOTE — Discharge Instructions (Signed)
Rest, Ice intermittently (in the first 24-48 hours), Gentle compression with an Ace wrap, and elevate (Limb above the level of the heart)   Take up to 800mg  of ibuprofen (that is usually 4 over the counter pills)  3 times a day for 5 days. Take with food.  Take percocet for breakthrough pain, do not drink alcohol, drive, care for children or do other critical tasks while taking percocet.  Please follow with your primary care doctor in the next 2 days for a check-up. They must obtain records for further management.   Do not hesitate to return to the Emergency Department for any new, worsening or concerning symptoms.    Contusion A contusion is a deep bruise. Contusions are the result of an injury that caused bleeding under the skin. The contusion may turn blue, purple, or yellow. Minor injuries will give you a painless contusion, but more severe contusions may stay painful and swollen for a few weeks.  CAUSES  A contusion is usually caused by a blow, trauma, or direct force to an area of the body. SYMPTOMS   Swelling and redness of the injured area.  Bruising of the injured area.  Tenderness and soreness of the injured area.  Pain. DIAGNOSIS  The diagnosis can be made by taking a history and physical exam. An X-ray, CT scan, or MRI may be needed to determine if there were any associated injuries, such as fractures. TREATMENT  Specific treatment will depend on what area of the body was injured. In general, the best treatment for a contusion is resting, icing, elevating, and applying cold compresses to the injured area. Over-the-counter medicines may also be recommended for pain control. Ask your caregiver what the best treatment is for your contusion. HOME CARE INSTRUCTIONS   Put ice on the injured area.  Put ice in a plastic bag.  Place a towel between your skin and the bag.  Leave the ice on for 15-20 minutes, 3-4 times a day, or as directed by your health care provider.  Only  take over-the-counter or prescription medicines for pain, discomfort, or fever as directed by your caregiver. Your caregiver may recommend avoiding anti-inflammatory medicines (aspirin, ibuprofen, and naproxen) for 48 hours because these medicines may increase bruising.  Rest the injured area.  If possible, elevate the injured area to reduce swelling. SEEK IMMEDIATE MEDICAL CARE IF:   You have increased bruising or swelling.  You have pain that is getting worse.  Your swelling or pain is not relieved with medicines. MAKE SURE YOU:   Understand these instructions.  Will watch your condition.  Will get help right away if you are not doing well or get worse. Document Released: 12/17/2004 Document Revised: 03/14/2013 Document Reviewed: 01/12/2011 [facility] Patient Information 2015 [place], [place]. This information is not intended to replace advice given to you by your health care provider. Make sure you discuss any questions you have with your health care provider.

## 2014-08-10 NOTE — ED Notes (Addendum)
Pt reports dropping a piece of steel onto his left ankle and left foot today. Laceration noted to area with edema noted. Pt states steel fell out of the back of his truck.

## 2014-08-10 NOTE — ED Notes (Signed)
Pt states a large piece of "steel" fell out of the bed of a truck, striking patient's left dorsal ankle, around 530pm tonight.  Pt denies taking any pain medication prior to ED visit today.  Ice pack applied.  Visible swelling and redness observed at site of impact on left ankle.

## 2014-08-10 NOTE — ED Provider Notes (Signed)
CSN: [id number]     Arrival date & time 08/10/14  1936 History   First MD Initiated Contact with Patient 08/10/14 1937     Chief Complaint  Patient presents with  . Ankle Injury     (Consider location/radiation/quality/duration/timing/severity/associated sxs/prior Treatment) HPI   Faustino CongressBrian K Barefield is a 29 y.o. male complaining of severe 9 out of 10 pain to dorsum of left ankle 5:30 PM, after a 40 pound piece of metal fell on the ankle. Patient has been using crutches, pain is described as sharp and exacerbated by weightbearing. Duration where there was contact with the piece of metal. Patient states his last tetanus shot was within the last year. No pain medication prior to arrival. Denies history of trauma or surgeries to the affected joints, numbness, weakness.   Past Medical History  Diagnosis Date  . Heart murmur    Past Surgical History  Procedure Laterality Date  . Circumcision    . Hernia repair     History reviewed. No pertinent family history. History  Substance Use Topics  . Smoking status: Current Some Day Smoker  . Smokeless tobacco: Not on file  . Alcohol Use: Yes    Review of Systems  10 systems reviewed and found to be negative, except as noted in the HPI.  Allergies  Review of patient's allergies indicates no known allergies.  Home Medications   Prior to Admission medications   Medication Sig Start Date End Date Taking? Authorizing Provider  omeprazole (PRILOSEC) 20 MG capsule Take 1 or 2 po Q 6hrs for pain 03/18/12   Devoria AlbeIva Knapp, MD  oxyCODONE-acetaminophen (PERCOCET/ROXICET) 5-325 MG per tablet 1 to 2 tabs PO q6hrs  PRN for pain 08/10/14   Joni ReiningNicole Coty Larsh, PA-C   BP 146/78 mmHg  Pulse 84  Temp(Src) 98.2 F (36.8 C) (Oral)  Resp 20  Ht 5\' 11"  (1.803 m)  Wt 220 lb (99.791 kg)  BMI 30.70 kg/m2  SpO2 100% Physical Exam  Constitutional: He is oriented to person, place, and time. He appears well-developed and well-nourished. No distress.  HENT:  Head:  Normocephalic.  Eyes: Conjunctivae and EOM are normal.  Cardiovascular: Normal rate.   Pulmonary/Chest: Effort normal. No stridor.  Musculoskeletal: Normal range of motion. He exhibits edema.  Deformity, 2 x 3 cm partial-thickness abrasion to lateral side of left ankle. Neurovascularly intact with diffuse tenderness to palpation along the lateral foot and malleolus.  Neurological: He is alert and oriented to person, place, and time.  Psychiatric: He has a normal mood and affect.  Nursing note and vitals reviewed.   ED Course  Procedures (including critical care time)\  SPLINT APPLICATION Date/Time: 8:24 PM Authorized by: Wynetta EmeryPISCIOTTA, Atasha Colebank Consent: Verbal consent obtained. Risks and benefits: risks, benefits and alternatives were discussed Consent given by: patient Splint applied by: EMT Location details: Left ankle  Splint type: Posterior short-leg splint  Supplies used: orthoglass Post-procedure: The splinted body part was neurovascularly unchanged following the procedure. Patient tolerance: Patient tolerated the procedure well with no immediate complications.    Labs Review Labs Reviewed - No data to display  Imaging Review Dg Ankle Complete Left  08/10/2014   CLINICAL DATA:  Dropped steel pipe on left ankle and foot, with abrasions and difficulty bearing weight. Initial encounter.  EXAM: LEFT ANKLE COMPLETE - 3+ VIEW  COMPARISON:  None.  FINDINGS: There appears to be a small avulsion fracture at the distal tip of the distal fibula. No additional fractures are seen. The ankle mortise is intact;  the interosseous space is within normal limits. No talar tilt or subluxation is seen.  The joint spaces are preserved. Mild dorsal soft tissue swelling is noted at the ankle.  IMPRESSION: Apparent small avulsion fracture at the distal tip of the distal fibula.   Electronically Signed   By: Roanna RaiderJeffery  Chang M.D.   On: 08/10/2014 20:01   Dg Foot Complete Left  08/10/2014   CLINICAL DATA:   Dropped a steel pipe on left foot and ankle. Abrasion. Difficulty bearing weight.  EXAM: LEFT FOOT - COMPLETE 3+ VIEW  COMPARISON:  None.  FINDINGS: There is no evidence of fracture or dislocation. There is no evidence of arthropathy or other focal bone abnormality. Soft tissues are unremarkable.  IMPRESSION: Negative.   Electronically Signed   By: Charlett NoseKevin  Dover M.D.   On: 08/10/2014 20:01     EKG Interpretation None      MDM   Final diagnoses:  Contusion of right ankle, initial encounter    Filed Vitals:   08/10/14 1939  BP: 146/78  Pulse: 84  Temp: 98.2 F (36.8 C)  TempSrc: Oral  Resp: 20  Height: 5\' 11"  (1.803 m)  Weight: 220 lb (99.791 kg)  SpO2: 100%    Medications  oxyCODONE-acetaminophen (PERCOCET/ROXICET) 5-325 MG per tablet 1 tablet (1 tablet Oral Given 08/10/14 2021)  ibuprofen (ADVIL,MOTRIN) tablet 400 mg (400 mg Oral Given 08/10/14 2021)    Faustino CongressBrian K Hathorne is a pleasant 29 y.o. male presenting with partial thickness abrasion and left foot and ankle pain after a 60 pound piece of metal fell on the foot at 5:30 PM tonight, tetanus is up-to-date. He is neurovascularly intact, he is non-ambulatory. X-ray shows a small avulsion fracture to the distal tip of the fibula. Patient will be placed in posterior short legs ankle splint, he has crutches. [facility]. Work note provided with limited activity.  Evaluation does not show pathology that would require ongoing emergent intervention or inpatient treatment. Pt is hemodynamically stable and mentating appropriately. Discussed findings and plan with patient/guardian, who agrees with care plan. All questions answered. Return precautions discussed and outpatient follow up given.   New Prescriptions   OXYCODONE-ACETAMINOPHEN (PERCOCET/ROXICET) 5-325 MG PER TABLET    1 to 2 tabs PO q6hrs  PRN for pain         Wynetta Emeryicole Monserratt Knezevic, PA-C 08/10/14 2025  Glynn OctaveStephen Rancour, MD 08/10/14 2200

## 2014-08-27 ENCOUNTER — Emergency Department (HOSPITAL_BASED_OUTPATIENT_CLINIC_OR_DEPARTMENT_OTHER)
Admission: EM | Admit: 2014-08-27 | Discharge: 2014-08-27 | Disposition: A | Discharge status: Home / Self Care | Payer: BLUE CROSS/BLUE SHIELD | Attending: Emergency Medicine | Admitting: Emergency Medicine

## 2014-08-27 ENCOUNTER — Encounter (HOSPITAL_BASED_OUTPATIENT_CLINIC_OR_DEPARTMENT_OTHER): Payer: Self-pay

## 2014-08-27 ENCOUNTER — Emergency Department (HOSPITAL_BASED_OUTPATIENT_CLINIC_OR_DEPARTMENT_OTHER): Payer: BLUE CROSS/BLUE SHIELD

## 2014-08-27 DIAGNOSIS — S63502A Unspecified sprain of left wrist, initial encounter: Secondary | ICD-10-CM | POA: Diagnosis not present

## 2014-08-27 DIAGNOSIS — Z720 Tobacco use: Secondary | ICD-10-CM | POA: Insufficient documentation

## 2014-08-27 DIAGNOSIS — Y9289 Other specified places as the place of occurrence of the external cause: Secondary | ICD-10-CM | POA: Diagnosis not present

## 2014-08-27 DIAGNOSIS — W271XXA Contact with garden tool, initial encounter: Secondary | ICD-10-CM | POA: Insufficient documentation

## 2014-08-27 DIAGNOSIS — Y9389 Activity, other specified: Secondary | ICD-10-CM | POA: Insufficient documentation

## 2014-08-27 DIAGNOSIS — R011 Cardiac murmur, unspecified: Secondary | ICD-10-CM | POA: Insufficient documentation

## 2014-08-27 DIAGNOSIS — S6992XA Unspecified injury of left wrist, hand and finger(s), initial encounter: Secondary | ICD-10-CM | POA: Diagnosis present

## 2014-08-27 DIAGNOSIS — Y998 Other external cause status: Secondary | ICD-10-CM | POA: Diagnosis not present

## 2014-08-27 MED ORDER — IBUPROFEN 600 MG PO TABS: 600.0000 mg | ORAL_TABLET | Freq: Four times a day (QID) | ORAL | Status: DC | PRN

## 2014-08-27 NOTE — Discharge Instructions (Signed)
Wrist Sprain  with Rehab  A sprain is an injury in which a ligament that maintains the proper alignment of a joint is partially or completely torn. The ligaments of the wrist are susceptible to sprains. Sprains are classified into three categories. Grade 1 sprains cause pain, but the tendon is not lengthened. Grade 2 sprains include a lengthened ligament because the ligament is stretched or partially ruptured. With grade 2 sprains there is still function, although the function may be diminished. Grade 3 sprains are characterized by a complete tear of the tendon or muscle, and function is usually impaired.  SYMPTOMS   · Pain tenderness, inflammation, and/or bruising (contusion) of the injury.  · A "pop" or tear felt and/or heard at the time of injury.  · Decreased wrist function.  CAUSES   A wrist sprain occurs when a force is placed on one or more ligaments that is greater than it/they can withstand. Common mechanisms of injury include:  · Catching a ball with you hands.  · Repetitive and/ or strenuous extension or flexion of the wrist.  RISK INCREASES WITH:  · Previous wrist injury.  · Contact sports (boxing or wrestling).  · Activities in which falling is common.  · Poor strength and flexibility.  · Improperly fitted or padded protective equipment.  PREVENTION  · Warm up and stretch properly before activity.  · Allow for adequate recovery between workouts.  · Maintain physical fitness:  ¨ Strength, flexibility, and endurance.  ¨ Cardiovascular fitness.  · Protect the wrist joint by limiting its motion with the use of taping, braces, or splints.  · Protect the wrist after injury for 6 to 12 months.  PROGNOSIS   The prognosis for wrist sprains depends on the degree of injury. Grade 1 sprains require 2 to 6 weeks of treatment. Grade 2 sprains require 6 to 8 weeks of treatment, and grade 3 sprains require up to 12 weeks.   RELATED COMPLICATIONS   · Prolonged healing time, if improperly treated or  re-injured.  · Recurrent symptoms that result in a chronic problem.  · Injury to nearby structures (bone, cartilage, nerves, or tendons).  · Arthritis of the wrist.  · Inability to compete in athletics at a high level.  · Wrist stiffness or weakness.  · Progression to a complete rupture of the ligament.  TREATMENT   Treatment initially involves resting from any activities that aggravate the symptoms, and the use of ice and medications to help reduce pain and inflammation. Your caregiver may recommend immobilizing the wrist for a period of time in order to reduce stress on the ligament and allow for healing. After immobilization it is important to perform strengthening and stretching exercises to help regain strength and a full range of motion. These exercises may be completed at home or with a therapist. Surgery is not usually required for wrist sprains, unless the ligament has been ruptured (grade 3 sprain).  MEDICATION   · If pain medication is necessary, then nonsteroidal anti-inflammatory medications, such as aspirin and ibuprofen, or other minor pain relievers, such as acetaminophen, are often recommended.  · Do not take pain medication for 7 days before surgery.  · Prescription pain relievers may be given if deemed necessary by your caregiver. Use only as directed and only as much as you need.  HEAT AND COLD  · Cold treatment (icing) relieves pain and reduces inflammation. Cold treatment should be applied for 10 to 15 minutes every 2 to 3 hours for inflammation   and pain and immediately after any activity that aggravates your symptoms. Use ice packs or massage the area with a piece of ice (ice massage).  · Heat treatment may be used prior to performing the stretching and strengthening activities prescribed by your caregiver, physical therapist, or athletic trainer. Use a heat pack or soak your injury in warm water.  SEEK MEDICAL CARE IF:  · Treatment seems to offer no benefit, or the condition worsens.  · Any  medications produce adverse side effects.  EXERCISES  RANGE OF MOTION (ROM) AND STRETCHING EXERCISES - Wrist Sprain   These exercises may help you when beginning to rehabilitate your injury. Your symptoms may resolve with or without further involvement from your physician, physical therapist or athletic trainer. While completing these exercises, remember:   · Restoring tissue flexibility helps normal motion to return to the joints. This allows healthier, less painful movement and activity.  · An effective stretch should be held for at least 30 seconds.  · A stretch should never be painful. You should only feel a gentle lengthening or release in the stretched tissue.  RANGE OF MOTION - Wrist Flexion, Active-Assisted  · Extend your right / left elbow with your fingers pointing down.*  · Gently pull the back of your hand towards you until you feel a gentle stretch on the top of your forearm.  · Hold this position for __________ seconds.  Repeat __________ times. Complete this exercise __________ times per day.   *If directed by your physician, physical therapist or athletic trainer, complete this stretch with your elbow bent rather than extended.  RANGE OF MOTION - Wrist Extension, Active-Assisted  · Extend your right / left elbow and turn your palm upwards.*  · Gently pull your palm/fingertips back so your wrist extends and your fingers point more toward the ground.  · You should feel a gentle stretch on the inside of your forearm.  · Hold this position for __________ seconds.  Repeat __________ times. Complete this exercise __________ times per day.  *If directed by your physician, physical therapist or athletic trainer, complete this stretch with your elbow bent, rather than extended.  RANGE OF MOTION - Supination, Active  · Stand or sit with your elbows at your side. Bend your right / left elbow to 90 degrees.  · Turn your palm upward until you feel a gentle stretch on the inside of your forearm.  · Hold this  position for __________ seconds. Slowly release and return to the starting position.  Repeat __________ times. Complete this stretch __________ times per day.   RANGE OF MOTION - Pronation, Active  · Stand or sit with your elbows at your side. Bend your right / left elbow to 90 degrees.  · Turn your palm downward until you feel a gentle stretch on the top of your forearm.  · Hold this position for __________ seconds. Slowly release and return to the starting position.  Repeat __________ times. Complete this stretch __________ times per day.   STRETCH - Wrist Flexion  · Place the back of your right / left hand on a tabletop leaving your elbow slightly bent. Your fingers should point away from your body.  · Gently press the back of your hand down onto the table by straightening your elbow. You should feel a stretch on the top of your forearm.  · Hold this position for __________ seconds.  Repeat __________ times. Complete this stretch __________ times per day.   STRETCH - Wrist   Extension  · Place your right / left fingertips on a tabletop leaving your elbow slightly bent. Your fingers should point backwards.  · Gently press your fingers and palm down onto the table by straightening your elbow. You should feel a stretch on the inside of your forearm.  · Hold this position for __________ seconds.  Repeat __________ times. Complete this stretch __________ times per day.   STRENGTHENING EXERCISES - Wrist Sprain  These exercises may help you when beginning to rehabilitate your injury. They may resolve your symptoms with or without further involvement from your physician, physical therapist or athletic trainer. While completing these exercises, remember:   · Muscles can gain both the endurance and the strength needed for everyday activities through controlled exercises.  · Complete these exercises as instructed by your physician, physical therapist or athletic trainer. Progress with the resistance and repetition exercises  only as your caregiver advises.  STRENGTH - Wrist Flexors  · Sit with your right / left forearm palm-up and fully supported. Your elbow should be resting below the height of your shoulder. Allow your wrist to extend over the edge of the surface.  · Loosely holding a __________ weight or a piece of rubber exercise band/tubing, slowly curl your hand up toward your forearm.  · Hold this position for __________ seconds. Slowly lower the wrist back to the starting position in a controlled manner.  Repeat __________ times. Complete this exercise __________ times per day.   STRENGTH - Wrist Extensors  · Sit with your right / left forearm palm-down and fully supported. Your elbow should be resting below the height of your shoulder. Allow your wrist to extend over the edge of the surface.  · Loosely holding a __________ weight or a piece of rubber exercise band/tubing, slowly curl your hand up toward your forearm.  · Hold this position for __________ seconds. Slowly lower the wrist back to the starting position in a controlled manner.  Repeat __________ times. Complete this exercise __________ times per day.   STRENGTH - Ulnar Deviators  · Stand with a ____________________ weight in your right / left hand, or sit holding on to the rubber exercise band/tubing with your opposite arm supported.  · Move your wrist so that your pinkie travels toward your forearm and your thumb moves away from your forearm.  · Hold this position for __________ seconds and then slowly lower the wrist back to the starting position.  Repeat __________ times. Complete this exercise __________ times per day  STRENGTH - Radial Deviators  · Stand with a ____________________ weight in your  · right / left hand, or sit holding on to the rubber exercise band/tubing with your arm supported.  · Raise your hand upward in front of you or pull up on the rubber tubing.  · Hold this position for __________ seconds and then slowly lower the wrist back to the  starting position.  Repeat __________ times. Complete this exercise __________ times per day.  STRENGTH - Forearm Supinators  · Sit with your right / left forearm supported on a table, keeping your elbow below shoulder height. Rest your hand over the edge, palm down.  · Gently grip a hammer or a soup ladle.  · Without moving your elbow, slowly turn your palm and hand upward to a "thumbs-up" position.  · Hold this position for __________ seconds. Slowly return to the starting position.  Repeat __________ times. Complete this exercise __________ times per day.   STRENGTH - Forearm   Pronators  · Sit with your right / left forearm supported on a table, keeping your elbow below shoulder height. Rest your hand over the edge, palm up.  · Gently grip a hammer or a soup ladle.  · Without moving your elbow, slowly turn your palm and hand upward to a "thumbs-up" position.  · Hold this position for __________ seconds. Slowly return to the starting position.  Repeat __________ times. Complete this exercise __________ times per day.   STRENGTH - Grip  · Grasp a tennis ball, a dense sponge, or a large, rolled sock in your hand.  · Squeeze as hard as you can without increasing any pain.  · Hold this position for __________ seconds. Release your grip slowly.  Repeat __________ times. Complete this exercise __________ times per day.   Document Released: 03/09/2005 Document Revised: 06/01/2011 Document Reviewed: 06/21/2008  [employer]® Patient Information ©2015 [employer]. This information is not intended to replace advice given to you by your health care provider. Make sure you discuss any questions you have with your health care provider.

## 2014-08-27 NOTE — ED Notes (Signed)
Patient transported to X-ray 

## 2014-08-27 NOTE — ED Provider Notes (Signed)
CSN: [id number]     Arrival date & time 08/27/14  1014 History   First MD Initiated Contact with Patient 08/27/14 1015     Chief Complaint  Patient presents with  . Wrist Pain   He was mowing his lawn and steering with his left hand. His mower tire hit a tree and jerked his steering wheel to the left, jerking his hand in that direction. Since then he has had swelling and pain in his wrist and fingers. This was on Friday and the pain has continued since. He hasn't tried anything for his pain. Pain is worse with supinating maneuvers. Denies any previous injury to that hand.  Pain is sharp and occuring on the dorsal aspect and only occuring when he is moving it. His work requires different tasks that make the pain worse.   (Consider location/radiation/quality/duration/timing/severity/associated sxs/prior Treatment) Patient is a 29 y.o. male presenting with wrist pain. The history is provided by the patient.  Wrist Pain This is a new problem. The current episode started in the past 7 days. The problem occurs constantly. The problem has been unchanged. Pertinent negatives include no arthralgias, myalgias or rash. The symptoms are aggravated by twisting. He has tried nothing for the symptoms.    Past Medical History  Diagnosis Date  . Heart murmur    Past Surgical History  Procedure Laterality Date  . Circumcision    . Hernia repair     No family history on file. History  Substance Use Topics  . Smoking status: Current Some Day Smoker  . Smokeless tobacco: Not on file  . Alcohol Use: Yes     Comment: occasional    Review of Systems  Musculoskeletal: Negative for myalgias and arthralgias.  Skin: Negative for color change, rash and wound.      Allergies  Review of patient's allergies indicates no known allergies.  Home Medications   Prior to Admission medications   Medication Sig Start Date End Date Taking? Authorizing Provider  ibuprofen (ADVIL,MOTRIN) 600 MG tablet Take 1  tablet (600 mg total) by mouth every 6 (six) hours as needed. 08/27/14   Myra Rude, MD   BP 127/75 mmHg  Pulse 78  Temp(Src) 98.4 F (36.9 C) (Oral)  Resp 16  Ht  (1.803 m)  Wt 220 lb (99.791 kg)  BMI 30.70 kg/m2  SpO2 97% Physical Exam  Constitutional: He appears well-developed and well-nourished.  HENT:  Head: Normocephalic and atraumatic.  Eyes: EOM are normal.  Neck: Normal range of motion.  Cardiovascular: Normal rate.   Pulmonary/Chest: Effort normal.  Musculoskeletal:       Arms: Left hand: swollen compared to the right hand.  Normal pincher  Normal thumb opposition  Normal finger adduction and abduction  Pain exacerbated with resisted supination Neurovascularly intact  Left wrist: No erythema or ecchymosis.  No pain upon palpation of metacarpals.  No pain upon palpation of carpal bones. Limited ROM in flexion and extension  Mild pain with resisted radial and ulnar deviation  No pain with loading of the TFCC     ED Course  Procedures (including critical care time) Labs Review Labs Reviewed - No data to display  Imaging Review Dg Wrist Complete Left  08/27/2014   CLINICAL DATA:  LEFT wrist pain and tenderness since Friday, injured in steering wheel at work, no improvement over weekend  EXAM: LEFT WRIST - COMPLETE 3+ VIEW  COMPARISON:  None  FINDINGS: Osseous mineralization normal.  Joint spaces preserved.  No  fracture, dislocation, or bone destruction.  IMPRESSION: Normal exam pre   Electronically Signed   By: Ulyses SouthwardMark  Boles M.D.   On: 08/27/2014 10:43     EKG Interpretation None      MDM   Final diagnoses:  Wrist sprain, left, initial encounter    Dorsal wrist pain most likely a sprain.  Imaging negative for fracture. He wants to continue to work so a removable wrist brace provided and ibuprofen for pain.  Information given for Dr. Jamey RipaHundall for follow up if the pain continues.    Myra RudeJeremy E Destani Wamser, MD PGY-2, [facility] 08/27/2014,  12:11 PM      Myra RudeJeremy E Mercedez Boule, MD 08/27/14 [id number]  Toy CookeyMegan Docherty, MD 08/29/14 [phone]

## 2015-10-14 ENCOUNTER — Emergency Department (HOSPITAL_BASED_OUTPATIENT_CLINIC_OR_DEPARTMENT_OTHER)
Admission: EM | Admit: 2015-10-14 | Discharge: 2015-10-15 | Disposition: A | Discharge status: Home / Self Care | Payer: BLUE CROSS/BLUE SHIELD | Attending: Emergency Medicine | Admitting: Emergency Medicine

## 2015-10-14 ENCOUNTER — Emergency Department (HOSPITAL_BASED_OUTPATIENT_CLINIC_OR_DEPARTMENT_OTHER): Payer: BLUE CROSS/BLUE SHIELD

## 2015-10-14 ENCOUNTER — Encounter (HOSPITAL_BASED_OUTPATIENT_CLINIC_OR_DEPARTMENT_OTHER): Payer: Self-pay

## 2015-10-14 DIAGNOSIS — Y939 Activity, unspecified: Secondary | ICD-10-CM | POA: Insufficient documentation

## 2015-10-14 DIAGNOSIS — Y9289 Other specified places as the place of occurrence of the external cause: Secondary | ICD-10-CM | POA: Diagnosis not present

## 2015-10-14 DIAGNOSIS — S0083XA Contusion of other part of head, initial encounter: Secondary | ICD-10-CM

## 2015-10-14 DIAGNOSIS — Y999 Unspecified external cause status: Secondary | ICD-10-CM | POA: Diagnosis not present

## 2015-10-14 DIAGNOSIS — F17200 Nicotine dependence, unspecified, uncomplicated: Secondary | ICD-10-CM | POA: Insufficient documentation

## 2015-10-14 DIAGNOSIS — S0181XA Laceration without foreign body of other part of head, initial encounter: Secondary | ICD-10-CM | POA: Insufficient documentation

## 2015-10-14 DIAGNOSIS — S060X1A Concussion with loss of consciousness of 30 minutes or less, initial encounter: Secondary | ICD-10-CM | POA: Insufficient documentation

## 2015-10-14 DIAGNOSIS — S0990XA Unspecified injury of head, initial encounter: Secondary | ICD-10-CM | POA: Diagnosis present

## 2015-10-14 MED ORDER — LIDOCAINE-EPINEPHRINE 2 %-1:100000 IJ SOLN
20.0000 mL | Freq: Once | INTRAMUSCULAR | Status: AC
  Administered 2015-10-14: 20 mL via INTRADERMAL
  Filled 2015-10-14: qty 1

## 2015-10-14 MED ORDER — OXYCODONE-ACETAMINOPHEN 5-325 MG PO TABS
1.0000 | ORAL_TABLET | Freq: Once | ORAL | Status: AC
  Administered 2015-10-14: 1 via ORAL
  Filled 2015-10-14: qty 1

## 2015-10-14 NOTE — ED Notes (Signed)
Patient transported to X-ray 

## 2015-10-14 NOTE — ED Notes (Signed)
Patient transported to CT 

## 2015-10-14 NOTE — ED Notes (Signed)
MD at bedside. 

## 2015-10-14 NOTE — ED Notes (Signed)
[place] [employer] at bedside  
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
## 2015-10-15 MED ORDER — ONDANSETRON 4 MG PO TBDP: 4.0000 mg | ORAL_TABLET | Freq: Three times a day (TID) | ORAL | 0 refills | Status: DC | PRN

## 2015-10-15 MED ORDER — OXYCODONE-ACETAMINOPHEN 5-325 MG PO TABS: 1.0000 | ORAL_TABLET | Freq: Four times a day (QID) | ORAL | 0 refills | Status: DC | PRN

## 2015-10-15 MED ORDER — ONDANSETRON 4 MG PO TBDP
4.0000 mg | ORAL_TABLET | Freq: Once | ORAL | Status: AC
  Administered 2015-10-15: 4 mg via ORAL

## 2015-10-15 MED ORDER — ONDANSETRON 4 MG PO TBDP
ORAL_TABLET | ORAL | Status: AC
  Filled 2015-10-15: qty 1
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
## 2016-04-16 ENCOUNTER — Emergency Department (HOSPITAL_BASED_OUTPATIENT_CLINIC_OR_DEPARTMENT_OTHER)
Admission: EM | Admit: 2016-04-16 | Discharge: 2016-04-16 | Disposition: A | Discharge status: Home / Self Care | Payer: BLUE CROSS/BLUE SHIELD | Attending: Emergency Medicine | Admitting: Emergency Medicine

## 2016-04-16 ENCOUNTER — Encounter (HOSPITAL_BASED_OUTPATIENT_CLINIC_OR_DEPARTMENT_OTHER): Payer: Self-pay | Admitting: *Deleted

## 2016-04-16 ENCOUNTER — Emergency Department (HOSPITAL_BASED_OUTPATIENT_CLINIC_OR_DEPARTMENT_OTHER): Payer: BLUE CROSS/BLUE SHIELD

## 2016-04-16 DIAGNOSIS — L723 Sebaceous cyst: Secondary | ICD-10-CM | POA: Insufficient documentation

## 2016-04-16 DIAGNOSIS — W1839XA Other fall on same level, initial encounter: Secondary | ICD-10-CM | POA: Insufficient documentation

## 2016-04-16 DIAGNOSIS — F17200 Nicotine dependence, unspecified, uncomplicated: Secondary | ICD-10-CM | POA: Diagnosis not present

## 2016-04-16 DIAGNOSIS — S0990XA Unspecified injury of head, initial encounter: Secondary | ICD-10-CM | POA: Diagnosis present

## 2016-04-16 DIAGNOSIS — W19XXXA Unspecified fall, initial encounter: Secondary | ICD-10-CM

## 2016-04-16 DIAGNOSIS — Y939 Activity, unspecified: Secondary | ICD-10-CM | POA: Diagnosis not present

## 2016-04-16 DIAGNOSIS — M542 Cervicalgia: Secondary | ICD-10-CM

## 2016-04-16 DIAGNOSIS — M545 Low back pain: Secondary | ICD-10-CM | POA: Insufficient documentation

## 2016-04-16 DIAGNOSIS — Y999 Unspecified external cause status: Secondary | ICD-10-CM | POA: Diagnosis not present

## 2016-04-16 DIAGNOSIS — Y929 Unspecified place or not applicable: Secondary | ICD-10-CM | POA: Diagnosis not present

## 2016-04-16 MED ORDER — IBUPROFEN 800 MG PO TABS: 800.0000 mg | ORAL_TABLET | Freq: Three times a day (TID) | ORAL | 0 refills | Status: DC

## 2016-04-16 MED ORDER — METHOCARBAMOL 500 MG PO TABS: 500.0000 mg | ORAL_TABLET | Freq: Two times a day (BID) | ORAL | 0 refills | Status: DC

## 2016-04-16 MED ORDER — LIDOCAINE HCL (PF) 1 % IJ SOLN
5.0000 mL | Freq: Once | INTRAMUSCULAR | Status: DC
  Filled 2016-04-16: qty 5

## 2016-04-16 MED ORDER — SULFAMETHOXAZOLE-TRIMETHOPRIM 800-160 MG PO TABS: 1.0000 | ORAL_TABLET | Freq: Two times a day (BID) | ORAL | 0 refills | Status: AC

## 2016-04-16 MED ORDER — HYDROCODONE-ACETAMINOPHEN 5-325 MG PO TABS: 2.0000 | ORAL_TABLET | ORAL | 0 refills | Status: DC | PRN

## 2016-04-16 MED ORDER — HYDROCODONE-ACETAMINOPHEN 5-325 MG PO TABS
1.0000 | ORAL_TABLET | Freq: Once | ORAL | Status: AC
  Administered 2016-04-16: 1 via ORAL
  Filled 2016-04-16: qty 1

## 2016-04-16 NOTE — Discharge Instructions (Signed)
Return if any problems.  See your Physician for recheck  °
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
## 2016-04-16 NOTE — ED Triage Notes (Signed)
Last week he was running on concrete and ran into the garage door track causing him to fall. Pain continues in the right and left neck. Pain in the left side of his neck radiates into his shoulder. He is ambulatory.

## 2019-01-19 ENCOUNTER — Other Ambulatory Visit: Payer: Self-pay

## 2019-01-19 ENCOUNTER — Emergency Department (HOSPITAL_BASED_OUTPATIENT_CLINIC_OR_DEPARTMENT_OTHER): Payer: BC Managed Care – PPO

## 2019-01-19 ENCOUNTER — Encounter (HOSPITAL_BASED_OUTPATIENT_CLINIC_OR_DEPARTMENT_OTHER): Payer: Self-pay | Admitting: *Deleted

## 2019-01-19 DIAGNOSIS — R0789 Other chest pain: Secondary | ICD-10-CM | POA: Insufficient documentation

## 2019-01-19 DIAGNOSIS — Z79899 Other long term (current) drug therapy: Secondary | ICD-10-CM | POA: Diagnosis not present

## 2019-01-19 DIAGNOSIS — F17210 Nicotine dependence, cigarettes, uncomplicated: Secondary | ICD-10-CM | POA: Diagnosis not present

## 2019-01-19 LAB — BASIC METABOLIC PANEL
Anion gap: 9 (ref 5–15)
BUN: 14 mg/dL (ref 6–20)
CO2: 26 mmol/L (ref 22–32)
Calcium: 9.5 mg/dL (ref 8.9–10.3)
Chloride: 102 mmol/L (ref 98–111)
Creatinine, Ser: 1.19 mg/dL (ref 0.61–1.24)
GFR calc Af Amer: >60 mL/min (ref >60)
GFR calc non Af Amer: >60 mL/min (ref >60)
Glucose, Bld: 94 mg/dL (ref 70–99)
Potassium: 4.1 mmol/L (ref 3.5–5.1)
Sodium: 137 mmol/L (ref 135–145)

## 2019-01-19 LAB — TROPONIN I (HIGH SENSITIVITY): Troponin I (High Sensitivity): 3 ng/L (ref <18)

## 2019-01-19 LAB — CBC
HCT: 45.4 % (ref 39.0–52.0)
Hemoglobin: 15.2 g/dL (ref 13.0–17.0)
MCH: 31 pg (ref 26.0–34.0)
MCHC: 33.5 g/dL (ref 30.0–36.0)
MCV: 92.7 fL (ref 80.0–100.0)
Platelets: 232 10*3/uL (ref 150–400)
RBC: 4.9 MIL/uL (ref 4.22–5.81)
RDW: 13.2 % (ref 11.5–15.5)
WBC: 10.9 10*3/uL — ABNORMAL HIGH (ref 4.0–10.5)
nRBC: 0 % (ref 0.0–0.2)

## 2019-01-19 MED ORDER — SODIUM CHLORIDE 0.9% FLUSH
3.0000 mL | Freq: Once | INTRAVENOUS | Status: DC
  Filled 2019-01-19: qty 3

## 2019-01-19 NOTE — ED Triage Notes (Signed)
Woke with pain in his chest this am. Pain is in the center of his chest and stabbing.

## 2019-01-20 ENCOUNTER — Emergency Department (HOSPITAL_BASED_OUTPATIENT_CLINIC_OR_DEPARTMENT_OTHER)
Admission: EM | Admit: 2019-01-20 | Discharge: 2019-01-20 | Disposition: A | Discharge status: Home / Self Care | Payer: BC Managed Care – PPO | Attending: Emergency Medicine | Admitting: Emergency Medicine

## 2019-01-20 DIAGNOSIS — R0789 Other chest pain: Secondary | ICD-10-CM

## 2019-01-20 LAB — TROPONIN I (HIGH SENSITIVITY): Troponin I (High Sensitivity): 2 ng/L (ref <18)

## 2019-01-20 NOTE — ED Provider Notes (Signed)
TIME SEEN: 2:14 AM  CHIEF COMPLAINT: Chest pain  HPI: Patient is a 33 year old male who presents the emergency department with sharp central chest pain without radiation that has been ongoing all day today.  States it is more painful when he tries to sit upright.  No other aggravating or alleviating factors.  No shortness of breath, nausea, vomiting, diaphoresis, dizziness, fevers, cough.  No family history of premature CAD.  He has no history of hypertension, diabetes or hyperlipidemia.  He is a smoker.  No history of PE, DVT, exogenous estrogen use, recent fractures, surgery, trauma, hospitalization or prolonged travel. No lower extremity swelling or pain. No calf tenderness.  States the pain is now a 1-2/10.   ROS: See HPI Constitutional: no fever  Eyes: no drainage  ENT: no runny nose   Cardiovascular:  chest pain  Resp: no SOB  GI: no vomiting GU: no dysuria Integumentary: no rash  Allergy: no hives  Musculoskeletal: no leg swelling  Neurological: no slurred speech ROS otherwise negative  PAST MEDICAL HISTORY/PAST SURGICAL HISTORY:  Past Medical History:  Diagnosis Date  . Heart murmur     MEDICATIONS:  Prior to Admission medications   Medication Sig Start Date End Date Taking? Authorizing Provider  HYDROcodone-acetaminophen (NORCO/VICODIN) 5-325 MG tablet Take 2 tablets by mouth every 4 (four) hours as needed. 04/16/16   Elson Areas, PA-C  ibuprofen (ADVIL,MOTRIN) 800 MG tablet Take 1 tablet (800 mg total) by mouth 3 (three) times daily. 04/16/16   Elson Areas, PA-C  methocarbamol (ROBAXIN) 500 MG tablet Take 1 tablet (500 mg total) by mouth 2 (two) times daily. 04/16/16   Elson Areas, PA-C  mupirocin ointment (BACTROBAN) 2 % Apply 1 application topically 2 (two) times daily.    [provider]  ondansetron (ZOFRAN ODT) 4 MG disintegrating tablet Take 1 tablet (4 mg total) by mouth every 8 (eight) hours as needed for nausea or vomiting. 10/15/15   Horton,  Mayer Masker, MD  oxyCODONE-acetaminophen (PERCOCET/ROXICET) 5-325 MG tablet Take 1-2 tablets by mouth every 6 (six) hours as needed for severe pain. 10/15/15   Horton, Mayer Masker, MD    ALLERGIES:  No Known Allergies  SOCIAL HISTORY:  Social History   Tobacco Use  . Smoking status: Current Some Day Smoker  . Smokeless tobacco: Current User  Substance Use Topics  . Alcohol use: Yes    Comment: weekly    FAMILY HISTORY: No family history on file.  EXAM: BP (!) 144/95   Pulse 63   Temp 97.7 F (36.5 C) (Oral)   Resp 15   Ht 5\' 11"  (1.803 m)   Wt 111.1 kg   SpO2 96%   BMI 34.17 kg/m  CONSTITUTIONAL: Alert and oriented and responds appropriately to questions. Well-appearing; well-nourished HEAD: Normocephalic EYES: Conjunctivae clear, pupils appear equal, EOMI ENT: normal nose; moist mucous membranes NECK: Supple, no meningismus, no nuchal rigidity, no LAD  CARD: RRR; S1 and S2 appreciated; no murmurs, no clicks, no rubs, no gallops CHEST:  Chest wall is tender to palpation over the mid to lower sternum which reproduces his pain.  No crepitus, ecchymosis, erythema, warmth, rash or other lesions present.   RESP: Normal chest excursion without splinting or tachypnea; breath sounds clear and equal bilaterally; no wheezes, no rhonchi, no rales, no hypoxia or respiratory distress, speaking full sentences ABD/GI: Normal bowel sounds; non-distended; soft, non-tender, no rebound, no guarding, no peritoneal signs, no hepatosplenomegaly BACK:  The back appears normal and is  non-tender to palpation, there is no CVA tenderness EXT: Normal ROM in all joints; non-tender to palpation; no edema; normal capillary refill; no cyanosis, no calf tenderness or swelling    SKIN: Normal color for age and race; warm; no rash NEURO: Moves all extremities equally PSYCH: The patient's mood and manner are appropriate. Grooming and personal hygiene are appropriate.  MEDICAL DECISION MAKING: Patient here  with atypical chest pain.  No infectious symptoms.  He has had 2 troponins here that have been negative.  Very low suspicion for ACS.  EKG shows no ischemic abnormality.  He is PERC negative.  Doubt PE.  Doubt dissection.  Chest x-ray shows no pulmonary edema, pneumonia or pneumothorax.  No infectious symptoms.  Symptoms seem secondary to chest wall pain as they are reproducible with palpation and worse with movement.  Recommend alternating Tylenol and Motrin.  Pain is very mild at the moment and he states he is comfortable with taking pain medication at home.  I feel he is safe for discharge.  At this time, I do not feel there is any life-threatening condition present. I have reviewed, interpreted and discussed all results (EKG, imaging, lab, urine as appropriate) and exam findings with patient/family. I have reviewed nursing notes and appropriate previous records.  I feel the patient is safe to be discharged home without further emergent workup and can continue workup as an outpatient as needed. Discussed usual and customary return precautions. Patient/family verbalize understanding and are comfortable with this plan.  Outpatient follow-up has been provided as needed. All questions have been answered.   Ricky Robertson was evaluated in Emergency Department on 01/20/2019 for the symptoms described in the history of present illness. He was evaluated in the context of the global COVID-19 pandemic, which necessitated consideration that the patient might be at risk for infection with the SARS-CoV-2 virus that causes COVID-19. Institutional protocols and algorithms that pertain to the evaluation of patients at risk for COVID-19 are in a state of rapid change based on information released by regulatory bodies including the CDC and federal and state organizations. These policies and algorithms were followed during the patient's care in the ED.    EKG Interpretation  Date/Time:  Thursday January 19 2019 22:45:11  EDT Ventricular Rate:  83 PR Interval:  142 QRS Duration: 92 QT Interval:  354 QTC Calculation: 415 R Axis:   51 Text Interpretation: Normal sinus rhythm Normal ECG No significant change since last tracing Confirmed by Ward, Cyril Mourning [phone]) on 01/19/2019 11:15:27 PM         Ward, Delice Bison, DO 01/20/19 1017

## 2019-01-20 NOTE — Discharge Instructions (Addendum)
You may alternate Tylenol 1000 mg every 6 hours as needed for pain and Ibuprofen 800 mg every 8 hours as needed for pain.  Please take Ibuprofen with food. ° °

## 2019-02-16 ENCOUNTER — Encounter (HOSPITAL_BASED_OUTPATIENT_CLINIC_OR_DEPARTMENT_OTHER): Payer: Self-pay | Admitting: Emergency Medicine

## 2019-02-16 ENCOUNTER — Other Ambulatory Visit: Payer: Self-pay

## 2019-02-16 ENCOUNTER — Emergency Department (HOSPITAL_BASED_OUTPATIENT_CLINIC_OR_DEPARTMENT_OTHER): Payer: BC Managed Care – PPO

## 2019-02-16 ENCOUNTER — Emergency Department (HOSPITAL_BASED_OUTPATIENT_CLINIC_OR_DEPARTMENT_OTHER)
Admission: EM | Admit: 2019-02-16 | Discharge: 2019-02-16 | Disposition: A | Discharge status: Home / Self Care | Payer: BC Managed Care – PPO | Attending: Emergency Medicine | Admitting: Emergency Medicine

## 2019-02-16 DIAGNOSIS — Z20822 Contact with and (suspected) exposure to covid-19: Secondary | ICD-10-CM

## 2019-02-16 DIAGNOSIS — Z79899 Other long term (current) drug therapy: Secondary | ICD-10-CM | POA: Diagnosis not present

## 2019-02-16 DIAGNOSIS — F17200 Nicotine dependence, unspecified, uncomplicated: Secondary | ICD-10-CM | POA: Insufficient documentation

## 2019-02-16 DIAGNOSIS — J209 Acute bronchitis, unspecified: Secondary | ICD-10-CM | POA: Diagnosis not present

## 2019-02-16 DIAGNOSIS — Z20828 Contact with and (suspected) exposure to other viral communicable diseases: Secondary | ICD-10-CM | POA: Diagnosis not present

## 2019-02-16 DIAGNOSIS — R05 Cough: Secondary | ICD-10-CM | POA: Diagnosis present

## 2019-02-16 LAB — SARS CORONAVIRUS 2 AG (30 MIN TAT): SARS Coronavirus 2 Ag: NEGATIVE

## 2019-02-16 MED ORDER — PREDNISONE 20 MG PO TABS: 60.0000 mg | ORAL_TABLET | Freq: Every day | ORAL | 0 refills | Status: DC

## 2019-02-16 MED ORDER — AZITHROMYCIN 250 MG PO TABS: 250.0000 mg | ORAL_TABLET | Freq: Every day | ORAL | 0 refills | Status: DC

## 2019-02-16 MED ORDER — PREDNISONE 50 MG PO TABS
60.0000 mg | ORAL_TABLET | Freq: Once | ORAL | Status: AC
  Administered 2019-02-16: 60 mg via ORAL
  Filled 2019-02-16: qty 1

## 2019-02-16 MED ORDER — ALBUTEROL SULFATE HFA 108 (90 BASE) MCG/ACT IN AERS
8.0000 | INHALATION_SPRAY | Freq: Once | RESPIRATORY_TRACT | Status: AC
  Administered 2019-02-16: 8 via RESPIRATORY_TRACT
  Filled 2019-02-16: qty 6.7

## 2019-02-16 NOTE — ED Provider Notes (Signed)
[facility] EMERGENCY DEPARTMENT Provider Note   CSN: [id number] Arrival date & time: 02/16/19  1202     History   Chief Complaint Chief Complaint  Patient presents with  . Cough    HPI Ricky Robertson is a 33 y.o. male.  He is presenting with being acutely sick since yesterday with cough shortness of breath, sore throat and one episode of diarrhea.  Here with family member with similar symptoms.  Smoker.  Said he used his son's nebulizer machine with some improvement this morning.  No known fever headache body aches.  No recent travel.     The history is provided by the patient.  Cough Cough characteristics:  Productive Sputum characteristics:  Nondescript Severity:  Moderate Onset quality:  Sudden Duration:  24 hours Timing:  Intermittent Progression:  Unchanged Chronicity:  New Smoker: yes   Context: sick contacts   Relieved by:  Nothing Worsened by:  Activity Ineffective treatments:  Beta-agonist inhaler Associated symptoms: shortness of breath and sore throat   Associated symptoms: no chest pain, no ear fullness, no eye discharge, no fever, no headaches, no myalgias and no rash   Risk factors: no recent travel     Past Medical History:  Diagnosis Date  . Heart murmur     There are no active problems to display for this patient.   Past Surgical History:  Procedure Laterality Date  . CIRCUMCISION    . HERNIA REPAIR          Home Medications    Prior to Admission medications   Medication Sig Start Date End Date Taking? Authorizing Provider  HYDROcodone-acetaminophen (NORCO/VICODIN) 5-325 MG tablet Take 2 tablets by mouth every 4 (four) hours as needed. 04/16/16   Elson Areas, PA-C  ibuprofen (ADVIL,MOTRIN) 800 MG tablet Take 1 tablet (800 mg total) by mouth 3 (three) times daily. 04/16/16   Elson Areas, PA-C  methocarbamol (ROBAXIN) 500 MG tablet Take 1 tablet (500 mg total) by mouth 2 (two) times daily. 04/16/16   Elson Areas, PA-C   mupirocin ointment (BACTROBAN) 2 % Apply 1 application topically 2 (two) times daily.    [provider]  ondansetron (ZOFRAN ODT) 4 MG disintegrating tablet Take 1 tablet (4 mg total) by mouth every 8 (eight) hours as needed for nausea or vomiting. 10/15/15   Horton, Mayer Masker, MD  oxyCODONE-acetaminophen (PERCOCET/ROXICET) 5-325 MG tablet Take 1-2 tablets by mouth every 6 (six) hours as needed for severe pain. 10/15/15   Horton, Mayer Masker, MD    Family History No family history on file.  Social History Social History   Tobacco Use  . Smoking status: Current Some Day Smoker  . Smokeless tobacco: Current User  Substance Use Topics  . Alcohol use: Yes    Comment: weekly  . Drug use: No     Allergies   Patient has no known allergies.   Review of Systems Review of Systems  Constitutional: Negative for fever.  HENT: Positive for sore throat.   Eyes: Negative for discharge.  Respiratory: Positive for cough and shortness of breath.   Cardiovascular: Negative for chest pain.  Gastrointestinal: Positive for diarrhea. Negative for abdominal pain.  Genitourinary: Negative for dysuria.  Musculoskeletal: Negative for myalgias.  Skin: Negative for rash.  Neurological: Negative for headaches.     Physical Exam Updated Vital Signs BP 135/82 (BP Location: Right Arm)   Pulse 82   Temp 99.8 F (37.7 C) (Oral)   Resp 20  Ht 5\' 11"  (1.803 m)   Wt 111.1 kg   SpO2 97%   BMI 34.17 kg/m   Physical Exam Vitals signs and nursing note reviewed.  Constitutional:      Appearance: He is well-developed.  HENT:     Head: Normocephalic and atraumatic.     Right Ear: Tympanic membrane normal.     Left Ear: Tympanic membrane normal.     Mouth/Throat:     Mouth: Mucous membranes are moist.     Pharynx: Oropharynx is clear. No oropharyngeal exudate or posterior oropharyngeal erythema.  Eyes:     Conjunctiva/sclera: Conjunctivae normal.  Neck:     Musculoskeletal: Neck supple.   Cardiovascular:     Rate and Rhythm: Normal rate and regular rhythm.     Heart sounds: No murmur.  Pulmonary:     Effort: Pulmonary effort is normal. No respiratory distress.     Breath sounds: Wheezing present.  Abdominal:     Palpations: Abdomen is soft.     Tenderness: There is no abdominal tenderness.  Musculoskeletal: Normal range of motion.     Right lower leg: No edema.     Left lower leg: No edema.  Skin:    General: Skin is warm and dry.     Capillary Refill: Capillary refill takes less than 2 seconds.  Neurological:     General: No focal deficit present.     Mental Status: He is alert.      ED Treatments / Results  Labs (all labs ordered are listed, but only abnormal results are displayed) Labs Reviewed  SARS CORONAVIRUS 2 AG (30 MIN TAT)  NOVEL CORONAVIRUS, NAA (HOSP ORDER, SEND-OUT TO REF LAB; TAT 18-24 HRS)    EKG None  Radiology Dg Chest Port 1 View  Result Date: 02/16/2019 CLINICAL DATA:  Cough, sore throat EXAM: PORTABLE CHEST 1 VIEW COMPARISON:  01/19/2019 FINDINGS: Lungs are clear.  No pleural effusion or pneumothorax. The heart is normal in size. IMPRESSION: No evidence of acute cardiopulmonary disease. Electronically Signed   By: Charline BillsSriyesh  Krishnan M.D.   On: 02/16/2019 12:44    Procedures Procedures (including critical care time)  Medications Ordered in ED Medications  albuterol (VENTOLIN HFA) 108 (90 Base) MCG/ACT inhaler 8 puff (has no administration in time range)  predniSONE (DELTASONE) tablet 60 mg (has no administration in time range)     Initial Impression / Assessment and Plan / ED Course  I have reviewed the triage vital signs and the nursing notes.  Pertinent labs & imaging results that were available during my care of the patient were reviewed by me and considered in my medical decision making (see chart for details).  Clinical Course as of Feb 16 1728  Thu Feb 16, 2019  [id number] 33 year old smoker here with 24 hours of cough  shortness of breath.  Diffuse wheezing on exam.  Low-grade fever here.  Sats good at 97%.  Differential diagnosis includes Covid, bronchitis, pneumonia.  Getting chest x-ray steroids albuterol inhaler Covid testing.  Here with daughter with similar symptoms.   [MB]  1239 Chest x-ray interpreted by me as no pneumothorax no infiltrate.   [MB]  1355 Patient's Covid testing negative.  Will treat for bronchitis with antibiotics steroids inhaler.  Send out Covid test.   [MB]    Clinical Course User Index [MB] Terrilee FilesButler, Michael C, MD   Faustino CongressBrian K Leano was evaluated in Emergency Department on 02/16/2019 for the symptoms described in the history of present illness. He was  evaluated in the context of the global COVID-19 pandemic, which necessitated consideration that the patient might be at risk for infection with the SARS-CoV-2 virus that causes COVID-19. Institutional protocols and algorithms that pertain to the evaluation of patients at risk for COVID-19 are in a state of rapid change based on information released by regulatory bodies including the CDC and federal and state organizations. These policies and algorithms were followed during the patient's care in the ED.       Final Clinical Impressions(s) / ED Diagnoses   Final diagnoses:  Acute bronchitis, unspecified organism  Person under investigation for COVID-19    ED Discharge Orders         Ordered    azithromycin (ZITHROMAX) 250 MG tablet  Daily     02/16/19 1355    predniSONE (DELTASONE) 20 MG tablet  Daily     02/16/19 1355           Hayden Rasmussen, MD 02/16/19 1730

## 2019-02-16 NOTE — ED Triage Notes (Signed)
Cough and sore throat since last night

## 2019-02-16 NOTE — Discharge Instructions (Signed)
You were evaluated in the emergency department for cough and shortness of breath.  Your rapid Covid test was negative and a send out test was ordered that will take 1 to 2 days.  We are treating you with antibiotics and steroids and inhaler for bronchitis.  Please follow-up with your doctor and return to the emergency department if any worsening symptoms.  You should isolate until the results of your second Covid test are back

## 2019-02-18 LAB — NOVEL CORONAVIRUS, NAA (HOSP ORDER, SEND-OUT TO REF LAB; TAT 18-24 HRS): SARS-CoV-2, NAA: NOT DETECTED

## 2021-01-09 ENCOUNTER — Emergency Department (HOSPITAL_BASED_OUTPATIENT_CLINIC_OR_DEPARTMENT_OTHER)
Admission: EM | Admit: 2021-01-09 | Discharge: 2021-01-10 | Disposition: A | Discharge status: Home / Self Care | Payer: BC Managed Care – PPO | Attending: Emergency Medicine | Admitting: Emergency Medicine

## 2021-01-09 ENCOUNTER — Emergency Department (HOSPITAL_BASED_OUTPATIENT_CLINIC_OR_DEPARTMENT_OTHER): Payer: BC Managed Care – PPO

## 2021-01-09 ENCOUNTER — Encounter (HOSPITAL_BASED_OUTPATIENT_CLINIC_OR_DEPARTMENT_OTHER): Payer: Self-pay | Admitting: Emergency Medicine

## 2021-01-09 ENCOUNTER — Other Ambulatory Visit: Payer: Self-pay

## 2021-01-09 DIAGNOSIS — K098 Other cysts of oral region, not elsewhere classified: Secondary | ICD-10-CM | POA: Diagnosis not present

## 2021-01-09 DIAGNOSIS — L0201 Cutaneous abscess of face: Secondary | ICD-10-CM | POA: Diagnosis not present

## 2021-01-09 DIAGNOSIS — F17200 Nicotine dependence, unspecified, uncomplicated: Secondary | ICD-10-CM | POA: Insufficient documentation

## 2021-01-09 DIAGNOSIS — L720 Epidermal cyst: Secondary | ICD-10-CM

## 2021-01-09 LAB — BASIC METABOLIC PANEL
Anion gap: 10 (ref 5–15)
BUN: 21 mg/dL — ABNORMAL HIGH (ref 6–20)
CO2: 25 mmol/L (ref 22–32)
Calcium: 9.7 mg/dL (ref 8.9–10.3)
Chloride: 104 mmol/L (ref 98–111)
Creatinine, Ser: 1.11 mg/dL (ref 0.61–1.24)
GFR, Estimated: >60 mL/min (ref >60)
Glucose, Bld: 86 mg/dL (ref 70–99)
Potassium: 3.7 mmol/L (ref 3.5–5.1)
Sodium: 139 mmol/L (ref 135–145)

## 2021-01-09 LAB — CBC WITH DIFFERENTIAL/PLATELET
Abs Immature Granulocytes: 0.04 10*3/uL (ref 0.00–0.07)
Basophils Absolute: 0.1 10*3/uL (ref 0.0–0.1)
Basophils Relative: 0 %
Eosinophils Absolute: 0.2 10*3/uL (ref 0.0–0.5)
Eosinophils Relative: 2 %
HCT: 43.2 % (ref 39.0–52.0)
Hemoglobin: 14.5 g/dL (ref 13.0–17.0)
Immature Granulocytes: 0 %
Lymphocytes Relative: 34 %
Lymphs Abs: 4.3 10*3/uL — ABNORMAL HIGH (ref 0.7–4.0)
MCH: 30.2 pg (ref 26.0–34.0)
MCHC: 33.6 g/dL (ref 30.0–36.0)
MCV: 90 fL (ref 80.0–100.0)
Monocytes Absolute: 1.1 10*3/uL — ABNORMAL HIGH (ref 0.1–1.0)
Monocytes Relative: 9 %
Neutro Abs: 7.1 10*3/uL (ref 1.7–7.7)
Neutrophils Relative %: 55 %
Platelets: 236 10*3/uL (ref 150–400)
RBC: 4.8 MIL/uL (ref 4.22–5.81)
RDW: 13.3 % (ref 11.5–15.5)
WBC: 12.8 10*3/uL — ABNORMAL HIGH (ref 4.0–10.5)
nRBC: 0 % (ref 0.0–0.2)

## 2021-01-09 MED ORDER — IOHEXOL 300 MG/ML  SOLN
100.0000 mL | Freq: Once | INTRAMUSCULAR | Status: AC | PRN
  Administered 2021-01-09: 75 mL via INTRAVENOUS

## 2021-01-09 MED ORDER — CEPHALEXIN 500 MG PO CAPS: 500.0000 mg | ORAL_CAPSULE | Freq: Four times a day (QID) | ORAL | 0 refills | Status: DC

## 2021-01-09 MED ORDER — LIDOCAINE-EPINEPHRINE (PF) 2 %-1:200000 IJ SOLN
10.0000 mL | Freq: Once | INTRAMUSCULAR | Status: AC
  Administered 2021-01-10: 10 mL
  Filled 2021-01-09: qty 20

## 2021-01-09 NOTE — ED Provider Notes (Addendum)
[facility] EMERGENCY DEPT Provider Note   CSN: [id number] Arrival date & time: 01/09/21  1738     History Chief Complaint  Patient presents with   Cyst    Ricky Robertson is a 35 y.o. male.  HPI  35 year old male with a history of facial abscess presenting to the emergency department with roughly 1 week of right-sided facial swelling, redness and pain.  He states that he has a history of I&D of abscesses of his right face.  He states that his pain is sharp, shooting, nonradiating and similar to prior pain and swelling associated with facial abscesses.  It is moderate in intensity.  He denies any facial motor weakness.  No facial droop.  No numbness.  No fevers or chills.  While in triage, the patient had spontaneous rupture of his abscess.  Past Medical History:  Diagnosis Date   Heart murmur     There are no problems to display for this patient.   Past Surgical History:  Procedure Laterality Date   CIRCUMCISION     HERNIA REPAIR         No family history on file.  Social History   Tobacco Use   Smoking status: Some Days   Smokeless tobacco: Current  Substance Use Topics   Alcohol use: Yes    Comment: weekly   Drug use: No    Home Medications Prior to Admission medications   Medication Sig Start Date End Date Taking? Authorizing Provider  sulfamethoxazole-trimethoprim (BACTRIM DS) 800-160 MG tablet Take 1 tablet by mouth 2 (two) times daily for 7 days. 01/10/21 01/17/21 Yes Ernie Avena, MD  azithromycin (ZITHROMAX) 250 MG tablet Take 1 tablet (250 mg total) by mouth daily. Take first 2 tablets together, then 1 every day until finished. 02/16/19   Terrilee Files, MD  HYDROcodone-acetaminophen (NORCO/VICODIN) 5-325 MG tablet Take 2 tablets by mouth every 4 (four) hours as needed. 04/16/16   Elson Areas, PA-C  ibuprofen (ADVIL,MOTRIN) 800 MG tablet Take 1 tablet (800 mg total) by mouth 3 (three) times daily. 04/16/16   Elson Areas, PA-C   methocarbamol (ROBAXIN) 500 MG tablet Take 1 tablet (500 mg total) by mouth 2 (two) times daily. 04/16/16   Elson Areas, PA-C  mupirocin ointment (BACTROBAN) 2 % Apply 1 application topically 2 (two) times daily.    [provider]  ondansetron (ZOFRAN ODT) 4 MG disintegrating tablet Take 1 tablet (4 mg total) by mouth every 8 (eight) hours as needed for nausea or vomiting. 10/15/15   Horton, Mayer Masker, MD  oxyCODONE-acetaminophen (PERCOCET/ROXICET) 5-325 MG tablet Take 1-2 tablets by mouth every 6 (six) hours as needed for severe pain. 10/15/15   Horton, Mayer Masker, MD  predniSONE (DELTASONE) 20 MG tablet Take 3 tablets (60 mg total) by mouth daily. 02/16/19   Terrilee Files, MD    Allergies    Patient has no known allergies.  Review of Systems   Review of Systems  HENT:  Positive for facial swelling.   All other systems reviewed and are negative.  Physical Exam Updated Vital Signs BP 139/87 (BP Location: Right Arm)   Pulse 74   Temp 98.6 F (37 C) (Oral)   Resp 20   Ht 5\' 11"  (1.803 m)   Wt 117.9 kg   SpO2 100%   BMI 36.26 kg/m   Physical Exam Vitals and nursing note reviewed.  Constitutional:      General: He is not in acute distress.  Appearance: He is well-developed.  HENT:     Head: Normocephalic and atraumatic.     Comments: Right-sided cystlike structure palpated anterior to the tragus of the right ear, surrounding erythema noted with swelling present, purulent material draining from a ruptured pustule.  Mild tenderness to palpation. Eyes:     Conjunctiva/sclera: Conjunctivae normal.     Pupils: Pupils are equal, round, and reactive to light.  Cardiovascular:     Rate and Rhythm: Normal rate and regular rhythm.     Heart sounds: No murmur heard. Pulmonary:     Effort: Pulmonary effort is normal. No respiratory distress.     Breath sounds: Normal breath sounds.  Abdominal:     General: There is no distension.     Palpations: Abdomen is soft.      Tenderness: There is no abdominal tenderness. There is no guarding.  Musculoskeletal:        General: No deformity or signs of injury.     Cervical back: Normal range of motion and neck supple.  Skin:    General: Skin is warm and dry.     Findings: No lesion or rash.  Neurological:     General: No focal deficit present.     Mental Status: He is alert. Mental status is at baseline.     GCS: GCS eye subscore is 4. GCS verbal subscore is 5. GCS motor subscore is 6.     Cranial Nerves: Cranial nerves 2-12 are intact.    ED Results / Procedures / Treatments   Labs (all labs ordered are listed, but only abnormal results are displayed) Labs Reviewed  CBC WITH DIFFERENTIAL/PLATELET - Abnormal; Notable for the following components:      Result Value   WBC 12.8 (*)    Lymphs Abs 4.3 (*)    Monocytes Absolute 1.1 (*)    All other components within normal limits  BASIC METABOLIC PANEL - Abnormal; Notable for the following components:   BUN 21 (*)    All other components within normal limits    EKG None  Radiology CT Maxillofacial W Contrast  Result Date: 01/09/2021 CLINICAL DATA:  Maxillary/facial abscess EXAM: CT MAXILLOFACIAL WITH CONTRAST TECHNIQUE: Multidetector CT imaging of the maxillofacial structures was performed with intravenous contrast. Multiplanar CT image reconstructions were also generated. CONTRAST:  38mL OMNIPAQUE IOHEXOL 300 MG/ML  SOLN COMPARISON:  10/15/2015. FINDINGS: Osseous: No acute fracture or mandibular dislocation. No destructive process. Unchanged appearance of previous nasal bone fracture. Orbits: Negative. No traumatic or inflammatory finding. Sinuses: Clear. Soft tissues: In the soft tissues overlying the right zygomatic arch, there is a subcutaneous, low-density, peripherally enhancing lesion that measures up to 2.5 x 1.7 x 2.2 cm (series 2, image 63 and series 6, image 30), which was previously on the 10/14/2015 exam where it measured 1.3 x 0.9 x 1.1 cm.  Presently there is mild superficial skin thickening with minimal surrounding fat stranding. No deep structures are involved. Limited intracranial: No significant or unexpected finding. IMPRESSION: 1. 2.5 cm subcutaneous lesion overlying the right zygomatic arch, most likely an inflamed epidermal inclusion cyst, which was present on the 2017 study although it has increased in size. 2. No acute facial bone fracture or destructive osseous lesion. Electronically Signed   By: Wiliam Ke M.D.   On: 01/09/2021 23:40    Procedures .Marland KitchenIncision and Drainage  Date/Time: 01/10/2021 12:04 AM Performed by: Ernie Avena, MD Authorized by: Ernie Avena, MD   Consent:    Consent obtained:  Verbal  Consent given by:  Patient   Risks discussed:  Bleeding, incomplete drainage, pain and infection Universal protocol:    Patient identity confirmed:  Verbally with patient Location:    Type:  Cyst (Infected epidermoid cyst)   Size:  2cm Anesthesia:    Anesthesia method:  Local infiltration   Local anesthetic:  Lidocaine 1% WITH epi Procedure type:    Complexity:  Simple Procedure details:    Incision types:  Stab incision   Incision depth:  Subcutaneous   Wound management:  Irrigated with saline   Drainage:  Purulent   Drainage amount:  Moderate   Wound treatment:  Wound left open   Packing materials:  None Post-procedure details:    Procedure completion:  Tolerated Comments:     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 Stab incision enlarged draining area of fluctuance with resultant expression of purulent material   Medications Ordered in ED Medications  iohexol (OMNIPAQUE) 300 MG/ML solution 100 mL (75 mLs Intravenous Contrast Given 01/09/21 2309)  lidocaine-EPINEPHrine (XYLOCAINE W/EPI) 2 %-1:200000 (PF) injection 10 mL (10 mLs Infiltration Given 01/10/21 0007)    ED Course  I have reviewed the triage vital signs and the nursing  notes.  Pertinent labs & imaging results that were available during my care of the patient were reviewed by me and considered in my medical decision making (see chart for details).    MDM Rules/Calculators/A&P                           35 year old male with a history of a right facial epidermoid cyst that occasionally will get infected and require incision and drainage has presented to the emergency department with concern for facial abscess versus infected epidermoid cyst.  Patient states that over the last week his cyst in the right side of his face has grown more painful, red and has diffusely swollen.  He denies any numbness or weakness or facial droop.  He denies any fevers or chills.  While in triage, the patient's right facial swelling ruptured and began draining spontaneously purulent material.  CT imaging was performed which revealed likely epidermoid cyst with surrounding enhancement suggestive of inflammatory changes and infection.  The patient arrived afebrile, mildly hypertensive, subsequent improvement on rechecks.  CBC revealed a leukocytosis to 12.8.  Patient was consented for and underwent I&D of the right face in the vicinity of his ruptured abscess to enlarge the area of rupture slightly with a single stab incision.  A significant amount of purulent material was expressed and drained.  The wound was irrigated with normal saline.  Given the patient's likely infected epidermoid cyst versus developing facial abscess, he was subsequently started on Bactrim for antibiotic coverage.  Return precautions provided and overall stable for continued management outpatient.  Final Clinical Impression(s) / ED Diagnoses Final diagnoses:  Epidermoid cyst  Facial abscess    Rx / DC Orders ED Discharge Orders          Ordered    sulfamethoxazole-trimethoprim (BACTRIM DS) 800-160 MG tablet  2 times daily        01/10/21 0002    cephALEXin (KEFLEX) 500 MG capsule  4 times daily,   Status:   Discontinued        01/09/21 2355             Ernie Avena, MD 01/10/21 1531    Ernie Avena, MD 01/10/21 [id number]

## 2021-01-09 NOTE — ED Triage Notes (Addendum)
Reports a cyst to the right side of his face for about a week.  Noted to be red, swollen, with a white head.  Area noted to be draining by the end of triage.

## 2021-01-10 MED ORDER — SULFAMETHOXAZOLE-TRIMETHOPRIM 800-160 MG PO TABS: 1.0000 | ORAL_TABLET | Freq: Two times a day (BID) | ORAL | 0 refills | Status: AC

## 2022-01-06 ENCOUNTER — Other Ambulatory Visit: Payer: Self-pay

## 2022-01-06 ENCOUNTER — Encounter (HOSPITAL_BASED_OUTPATIENT_CLINIC_OR_DEPARTMENT_OTHER): Payer: Self-pay | Admitting: Emergency Medicine

## 2022-01-06 ENCOUNTER — Emergency Department (HOSPITAL_BASED_OUTPATIENT_CLINIC_OR_DEPARTMENT_OTHER)
Admission: EM | Admit: 2022-01-06 | Discharge: 2022-01-07 | Disposition: A | Discharge status: Home / Self Care | Payer: BC Managed Care – PPO | Attending: Emergency Medicine | Admitting: Emergency Medicine

## 2022-01-06 ENCOUNTER — Emergency Department (HOSPITAL_BASED_OUTPATIENT_CLINIC_OR_DEPARTMENT_OTHER): Payer: BC Managed Care – PPO

## 2022-01-06 DIAGNOSIS — N452 Orchitis: Secondary | ICD-10-CM | POA: Diagnosis not present

## 2022-01-06 DIAGNOSIS — N50812 Left testicular pain: Secondary | ICD-10-CM | POA: Diagnosis present

## 2022-01-06 DIAGNOSIS — R21 Rash and other nonspecific skin eruption: Secondary | ICD-10-CM | POA: Insufficient documentation

## 2022-01-06 LAB — COMPREHENSIVE METABOLIC PANEL
ALT: 30 U/L (ref 0–44)
AST: 21 U/L (ref 15–41)
Albumin: 4.3 g/dL (ref 3.5–5.0)
Alkaline Phosphatase: 79 U/L (ref 38–126)
Anion gap: 10 (ref 5–15)
BUN: 16 mg/dL (ref 6–20)
CO2: 24 mmol/L (ref 22–32)
Calcium: 9.3 mg/dL (ref 8.9–10.3)
Chloride: 105 mmol/L (ref 98–111)
Creatinine, Ser: 1.11 mg/dL (ref 0.61–1.24)
GFR, Estimated: >60 mL/min (ref >60)
Glucose, Bld: 150 mg/dL — ABNORMAL HIGH (ref 70–99)
Potassium: 3.4 mmol/L — ABNORMAL LOW (ref 3.5–5.1)
Sodium: 139 mmol/L (ref 135–145)
Total Bilirubin: 0.7 mg/dL (ref 0.3–1.2)
Total Protein: 7 g/dL (ref 6.5–8.1)

## 2022-01-06 LAB — URINALYSIS, ROUTINE W REFLEX MICROSCOPIC
Bilirubin Urine: NEGATIVE
Glucose, UA: NEGATIVE mg/dL
Ketones, ur: NEGATIVE mg/dL
Leukocytes,Ua: NEGATIVE
Nitrite: NEGATIVE
Specific Gravity, Urine: 1.033 — ABNORMAL HIGH (ref 1.005–1.030)
pH: 5.5 (ref 5.0–8.0)

## 2022-01-06 LAB — CBC WITH DIFFERENTIAL/PLATELET
Abs Immature Granulocytes: 0.01 10*3/uL (ref 0.00–0.07)
Basophils Absolute: 0 10*3/uL (ref 0.0–0.1)
Basophils Relative: 1 %
Eosinophils Absolute: 0.1 10*3/uL (ref 0.0–0.5)
Eosinophils Relative: 1 %
HCT: 40.7 % (ref 39.0–52.0)
Hemoglobin: 13.9 g/dL (ref 13.0–17.0)
Immature Granulocytes: 0 %
Lymphocytes Relative: 22 %
Lymphs Abs: 1.5 10*3/uL (ref 0.7–4.0)
MCH: 30.2 pg (ref 26.0–34.0)
MCHC: 34.2 g/dL (ref 30.0–36.0)
MCV: 88.3 fL (ref 80.0–100.0)
Monocytes Absolute: 0.8 10*3/uL (ref 0.1–1.0)
Monocytes Relative: 12 %
Neutro Abs: 4.2 10*3/uL (ref 1.7–7.7)
Neutrophils Relative %: 64 %
Platelets: 169 10*3/uL (ref 150–400)
RBC: 4.61 MIL/uL (ref 4.22–5.81)
RDW: 13.6 % (ref 11.5–15.5)
WBC: 6.6 10*3/uL (ref 4.0–10.5)
nRBC: 0 % (ref 0.0–0.2)

## 2022-01-06 LAB — CK: Total CK: 195 U/L (ref 49–397)

## 2022-01-06 MED ORDER — DOXYCYCLINE HYCLATE 100 MG PO TABS
100.0000 mg | ORAL_TABLET | Freq: Once | ORAL | Status: AC
  Administered 2022-01-06: 100 mg via ORAL
  Filled 2022-01-06: qty 1

## 2022-01-06 MED ORDER — ONDANSETRON HCL 4 MG/2ML IJ SOLN
INTRAMUSCULAR | Status: AC
  Filled 2022-01-06: qty 2

## 2022-01-06 MED ORDER — MORPHINE SULFATE (PF) 4 MG/ML IV SOLN
4.0000 mg | Freq: Once | INTRAVENOUS | Status: AC
  Administered 2022-01-06: 4 mg via INTRAVENOUS

## 2022-01-06 MED ORDER — ONDANSETRON HCL 4 MG/2ML IJ SOLN
4.0000 mg | Freq: Once | INTRAMUSCULAR | Status: AC
  Administered 2022-01-06: 4 mg via INTRAVENOUS

## 2022-01-06 MED ORDER — MORPHINE SULFATE (PF) 4 MG/ML IV SOLN
INTRAVENOUS | Status: AC
  Filled 2022-01-06: qty 1

## 2022-01-06 MED ORDER — SODIUM CHLORIDE 0.9 % IV SOLN
1.0000 g | Freq: Once | INTRAVENOUS | Status: AC
  Administered 2022-01-06: 1 g via INTRAVENOUS
  Filled 2022-01-06: qty 10

## 2022-01-06 MED ORDER — SODIUM CHLORIDE 0.9 % IV BOLUS
1000.0000 mL | Freq: Once | INTRAVENOUS | Status: AC
  Administered 2022-01-06: 1000 mL via INTRAVENOUS

## 2022-01-06 MED ORDER — DOXYCYCLINE HYCLATE 100 MG PO CAPS: 100.0000 mg | ORAL_CAPSULE | Freq: Two times a day (BID) | ORAL | 0 refills | Status: DC

## 2022-01-06 MED ORDER — PENICILLIN G BENZATHINE 1200000 UNIT/2ML IM SUSY
2.4000 10*6.[IU] | PREFILLED_SYRINGE | Freq: Once | INTRAMUSCULAR | Status: AC
  Administered 2022-01-06: 2.4 10*6.[IU] via INTRAMUSCULAR
  Filled 2022-01-06: qty 4

## 2022-01-06 NOTE — ED Provider Notes (Signed)
[facility] EMERGENCY DEPT Provider Note   CSN: [id number] Arrival date & time: 01/06/22  2008     History  Chief Complaint  Patient presents with   Hematuria   Rash    Ricky Robertson is a 36 y.o. male.  36 yo M with a chief complaints of a rash.  This started on his back and then extended diffusely.  He denies it really bothering him.  He has felt hot and cold off and on.  No known sick contacts no recent travel no tick bites.  He is developed some muscle aches and then noticed that he was having some pain especially into his left testicle.  Denies trauma to the area.  He went to urgent care and was evaluated.  There was some concern for hematuria and he was sent here for CT imaging.   Hematuria  Rash      Home Medications Prior to Admission medications   Medication Sig Start Date End Date Taking? Authorizing Provider  doxycycline (VIBRAMYCIN) 100 MG capsule Take 1 capsule (100 mg total) by mouth 2 (two) times daily. One po bid x 7 days 01/06/22  Yes Melene Plan, DO  azithromycin (ZITHROMAX) 250 MG tablet Take 1 tablet (250 mg total) by mouth daily. Take first 2 tablets together, then 1 every day until finished. 02/16/19   Terrilee Files, MD  HYDROcodone-acetaminophen (NORCO/VICODIN) 5-325 MG tablet Take 2 tablets by mouth every 4 (four) hours as needed. 04/16/16   Elson Areas, PA-C  ibuprofen (ADVIL,MOTRIN) 800 MG tablet Take 1 tablet (800 mg total) by mouth 3 (three) times daily. 04/16/16   Elson Areas, PA-C  methocarbamol (ROBAXIN) 500 MG tablet Take 1 tablet (500 mg total) by mouth 2 (two) times daily. 04/16/16   Elson Areas, PA-C  mupirocin ointment (BACTROBAN) 2 % Apply 1 application topically 2 (two) times daily.    [provider]  ondansetron (ZOFRAN ODT) 4 MG disintegrating tablet Take 1 tablet (4 mg total) by mouth every 8 (eight) hours as needed for nausea or vomiting. 10/15/15   Horton, Mayer Masker, MD  oxyCODONE-acetaminophen  (PERCOCET/ROXICET) 5-325 MG tablet Take 1-2 tablets by mouth every 6 (six) hours as needed for severe pain. 10/15/15   Horton, Mayer Masker, MD  predniSONE (DELTASONE) 20 MG tablet Take 3 tablets (60 mg total) by mouth daily. 02/16/19   Terrilee Files, MD      Allergies    Patient has no known allergies.    Review of Systems   Review of Systems  Genitourinary:  Positive for hematuria.  Skin:  Positive for rash.    Physical Exam Updated Vital Signs BP (!) 151/86 (BP Location: Right Arm)   Pulse 99   Temp 99.2 F (37.3 C) (Oral)   Resp 18   Ht 5\' 10"  (1.778 m)   Wt 111.1 kg   SpO2 98%   BMI 35.15 kg/m  Physical Exam Vitals and nursing note reviewed.  Constitutional:      Appearance: He is well-developed.  HENT:     Head: Normocephalic and atraumatic.     Comments: Swollen turbinates posterior nasal drip Eyes:     Pupils: Pupils are equal, round, and reactive to light.  Neck:     Vascular: No JVD.  Cardiovascular:     Rate and Rhythm: Normal rate and regular rhythm.     Heart sounds: No murmur heard.    No friction rub. No gallop.  Pulmonary:  Effort: No respiratory distress.     Breath sounds: No wheezing.  Abdominal:     General: There is no distension.     Tenderness: There is no abdominal tenderness. There is no guarding or rebound.  Genitourinary:    Comments: Left testicle with tenderness and abnormal lie.  No obvious hernia.  No lymphadenopathy. Musculoskeletal:        General: Normal range of motion.     Cervical back: Normal range of motion and neck supple.  Skin:    Coloration: Skin is not pale.     Findings: No rash.     Comments: Diffuse sandpaper like rash.  Neurological:     Mental Status: He is alert and oriented to person, place, and time.  Psychiatric:        Behavior: Behavior normal.     ED Results / Procedures / Treatments   Labs (all labs ordered are listed, but only abnormal results are displayed) Labs Reviewed  URINALYSIS,  ROUTINE W REFLEX MICROSCOPIC - Abnormal; Notable for the following components:      Result Value   Specific Gravity, Urine 1.033 (*)    Hgb urine dipstick TRACE (*)    Protein, ur TRACE (*)    All other components within normal limits  COMPREHENSIVE METABOLIC PANEL - Abnormal; Notable for the following components:   Potassium 3.4 (*)    Glucose, Bld 150 (*)    All other components within normal limits  URINE CULTURE  CK  CBC WITH DIFFERENTIAL/PLATELET  RPR  HIV ANTIBODY (ROUTINE TESTING W REFLEX)  GC/CHLAMYDIA PROBE AMP ([facility]) NOT AT [facility]    EKG None  Radiology US SCROTUM W/DOPPLER  Result Date: 01/06/2022 CLINICAL DATA:  Left testicle pain none assess EXAM: SCROTAL ULTRASOUND DOPPLER ULTRASOUND OF THE TESTICLES TECHNIQUE: Complete ultrasound examination of the testicles, epididymis, and other scrotal structures was performed. Color and spectral Doppler ultrasound were also utilized to evaluate blood flow to the testicles. COMPARISON:  None Available. FINDINGS: Right testicle Measurements: 4.8 x 2.4 x 2.9 cm. No mass or microlithiasis visualized. Hypervascular in appearance Left testicle Measurements: 5.1 x 2.9 x 3.1 cm. No mass or microlithiasis visualized. Hypervascular in appearance Right epididymis: Hypervascular in appearance. Left epididymis:  Hypervascular in appearance. Hydrocele:  None visualized. Varicocele:  None visualized. Pulsed Doppler interrogation of both testes demonstrates normal low resistance arterial and venous waveforms bilaterally. IMPRESSION: 1. Negative for testicular torsion 2. Hypervascular left greater than right epididymis and testes, consistent with epididymal orchitis. Electronically Signed   By: Jasmine Pang M.D.   On: 01/06/2022 22:21    Procedures Procedures    Medications Ordered in ED Medications  cefTRIAXone (ROCEPHIN) 1 g in sodium chloride 0.9 % 100 mL IVPB (has no administration in time range)  doxycycline (VIBRA-TABS) tablet 100 mg  (has no administration in time range)  penicillin g benzathine (BICILLIN LA) 1200000 UNIT/2ML injection 2.4 Million Units (has no administration in time range)  sodium chloride 0.9 % bolus 1,000 mL (1,000 mLs Intravenous New Bag/Given 01/06/22 2135)  morphine (PF) 4 MG/ML injection 4 mg (4 mg Intravenous Given 01/06/22 2132)  ondansetron (ZOFRAN) injection 4 mg (4 mg Intravenous Given 01/06/22 2128)    ED Course/ Medical Decision Making/ A&P                           Medical Decision Making Amount and/or Complexity of Data Reviewed Labs: ordered. Radiology: ordered.  Risk Prescription drug management.  36 yo M with a chief complaints of diffuse palpable rash, fevers chills myalgias and left testicular pain.  The testicle has an abnormal lie and is somewhat concerning for testicular torsion.  This started yesterday.  Atraumatic.  We will obtain an urgent scrotal ultrasound.  Not sure how this is connected with the rash.  Suspect that most likely is due to a viral syndrome.  The patient is immunized.  No recent travel.  No history of immunosuppression.  Most clinically fits with a strep rash but has no signs of strep on throat exam.  He was sent from urgent care with concern for hematuria.  His dipstick shows trace blood in the urine but on microscopic there are no red blood cells.  We will send off a CK.  Bolus of IV fluids.  Ultrasound of the scrotum without torsion.  More concerning for orchitis.  I discussed the case with infectious disease, Dr. Tommy Medal based on my description of the patient's history and exam he recommended testing and treatment for syphilis as well as gonorrhea and chlamydia.  Sending off testing for HIV.  Recommended following up in the ID clinic.  11:24 PM:  I have discussed the diagnosis/risks/treatment options with the patient and family.  Evaluation and diagnostic testing in the emergency department does not suggest an emergent condition requiring admission or  immediate intervention beyond what has been performed at this time.  They will follow up with ID. We also discussed returning to the ED immediately if new or worsening sx occur. We discussed the sx which are most concerning (e.g., sudden worsening pain, fever, inability to tolerate by mouth) that necessitate immediate return. Medications administered to the patient during their visit and any new prescriptions provided to the patient are listed below.  Medications given during this visit Medications  cefTRIAXone (ROCEPHIN) 1 g in sodium chloride 0.9 % 100 mL IVPB (has no administration in time range)  doxycycline (VIBRA-TABS) tablet 100 mg (has no administration in time range)  penicillin g benzathine (BICILLIN LA) 1200000 UNIT/2ML injection 2.4 Million Units (has no administration in time range)  sodium chloride 0.9 % bolus 1,000 mL (1,000 mLs Intravenous New Bag/Given 01/06/22 2135)  morphine (PF) 4 MG/ML injection 4 mg (4 mg Intravenous Given 01/06/22 2132)  ondansetron (ZOFRAN) injection 4 mg (4 mg Intravenous Given 01/06/22 2128)     The patient appears reasonably screen and/or stabilized for discharge and I doubt any other medical condition or other [facility] requiring further screening, evaluation, or treatment in the ED at this time prior to discharge.          Final Clinical Impression(s) / ED Diagnoses Final diagnoses:  Rash and nonspecific skin eruption  Orchitis    Rx / DC Orders ED Discharge Orders          Ordered    doxycycline (VIBRAMYCIN) 100 MG capsule  2 times daily        01/06/22 2306              Deno Etienne, DO 01/06/22 2324

## 2022-01-06 NOTE — ED Triage Notes (Signed)
Pt arrived POV. Pt caox4 and ambulatory. Pt reports he noticed a rash on his stomach Monday which progressed to his torso and extremities. Pt also c/o nausea and pain in the testicles. Pt advised he was evaluated at urgent care and was told to come to the ED d/t having blood in the urine.

## 2022-01-07 ENCOUNTER — Telehealth: Payer: Self-pay

## 2022-01-07 NOTE — Telephone Encounter (Signed)
Ricky Robertson, [name], MD  P [facility] Triage Nurse Pool We need to see this patient in clinic, rash, orchitis and possible syphilis, gc, HIV        Per provider called patient to schedule hospital follow up. Not able to reach patient at this time. Left voicemail requesting call back. Labs pending from 10/17. Leatrice Jewels, RMA

## 2022-01-08 LAB — URINE CULTURE: Culture: NO GROWTH

## 2022-01-08 LAB — GC/CHLAMYDIA PROBE AMP (~~LOC~~) NOT AT ARMC
Chlamydia: NEGATIVE
Comment: NEGATIVE
Comment: NORMAL
Neisseria Gonorrhea: NEGATIVE

## 2022-01-08 LAB — RPR: RPR Ser Ql: NONREACTIVE

## 2022-01-08 LAB — HIV ANTIBODY (ROUTINE TESTING W REFLEX): HIV Screen 4th Generation wRfx: NONREACTIVE

## 2022-01-09 NOTE — Telephone Encounter (Signed)
I spoke with the patient and patient states his rash has improved and does not feel he needs to come in.  Ricky Robertson T Brooks Sailors

## 2022-02-25 IMAGING — CT CT MAXILLOFACIAL W/ CM
3 series · 15 of 47 positions shown, 18 images · IV contrast (omnipaque)
Comparison: 10/15/2015.

CLINICAL DATA: Maxillary/facial abscess

EXAM:
CT MAXILLOFACIAL WITH CONTRAST
TECHNIQUE: Multidetector CT imaging of the maxillofacial structures was
performed with intravenous contrast. Multiplanar CT image
reconstructions were also generated.
CONTRAST:  75mL OMNIPAQUE IOHEXOL 300 MG/ML  SOLN

[Series 2: 1 max soft · axial · 0.40mm/px · z∈[+809,+965]mm · 9 of 92 slices shown, 12 images]
[im 7/92  brain]
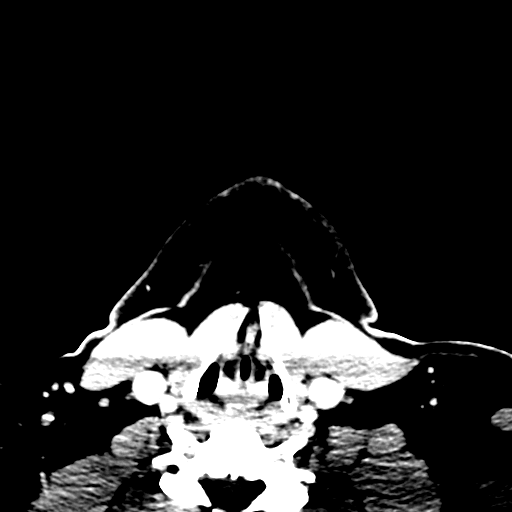
[im 7/92  bone]
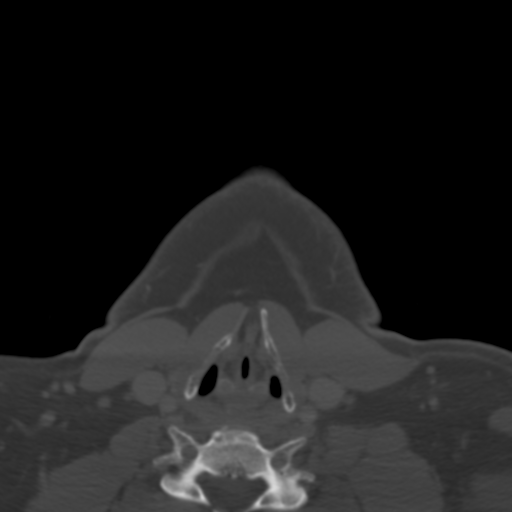
[im 16/92  bone]
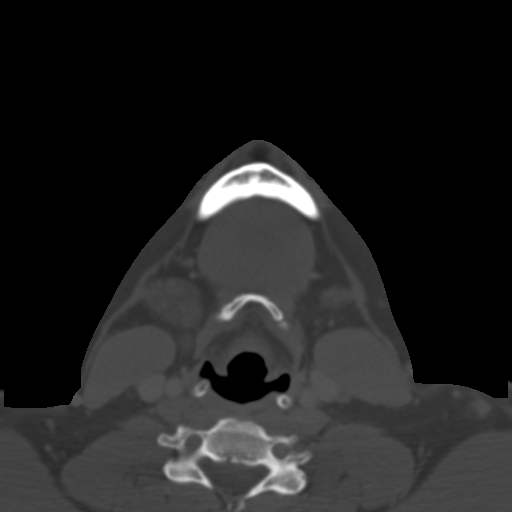
[im 26/92  bone]
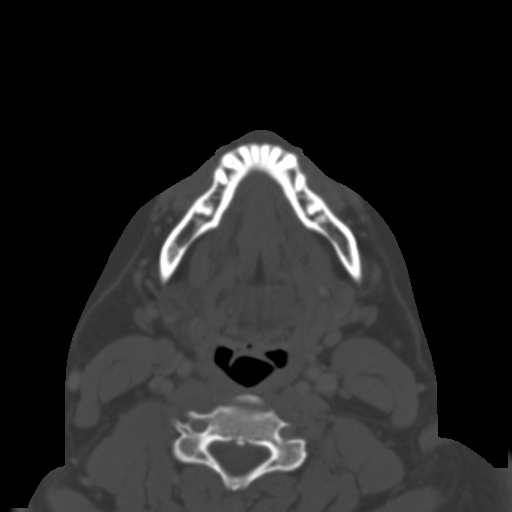
[im 35/92  bone]
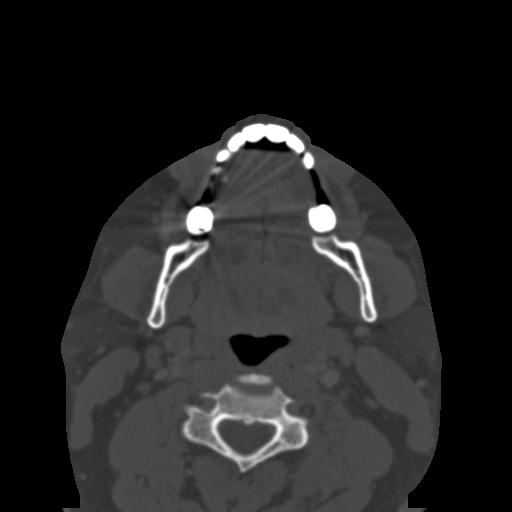
[im 48/92  brain]
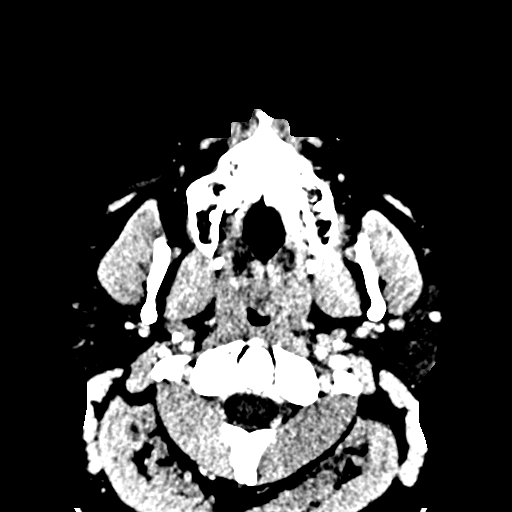
[im 48/92  bone]
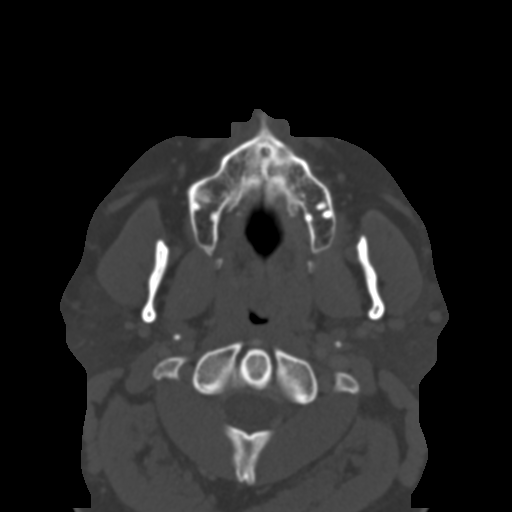
[im 57/92  bone]
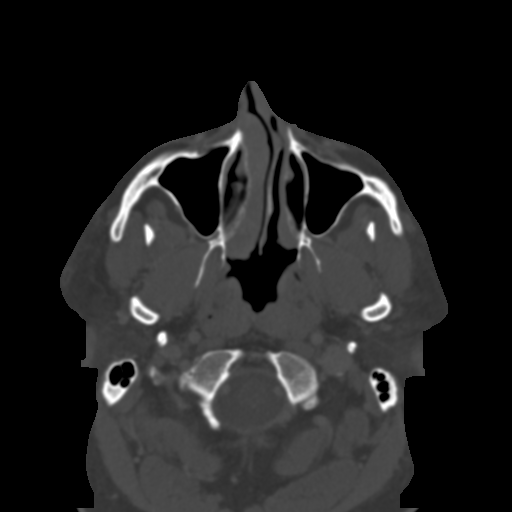
[im 66/92  bone]
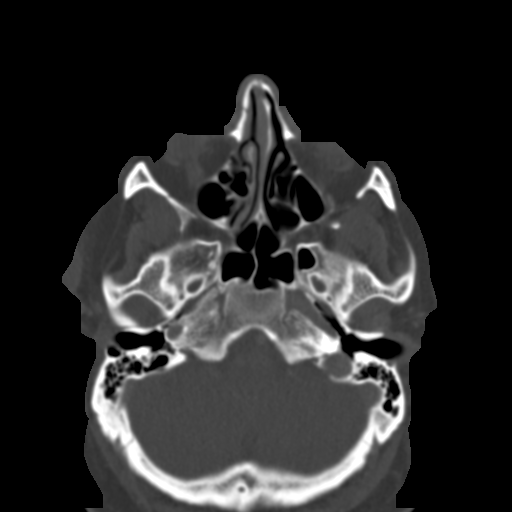
[im 76/92  bone]
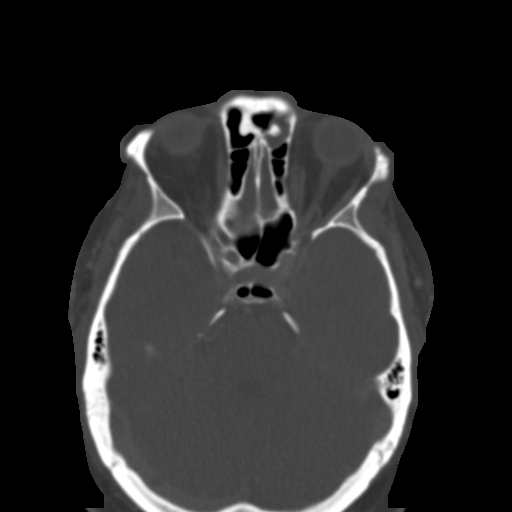
[im 85/92  brain]
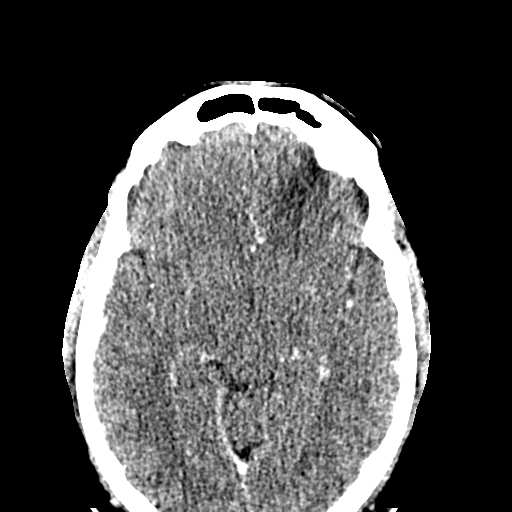
[im 85/92  bone]
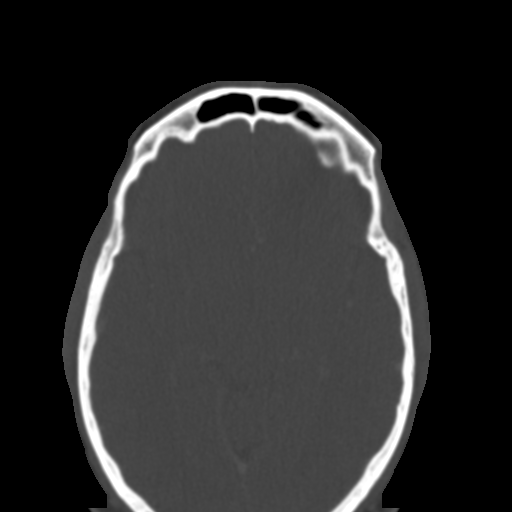

[Series 6: coronal soft · coronal · 0.39mm/px · 3 of 81 slices shown]
[im 27/81  bone]
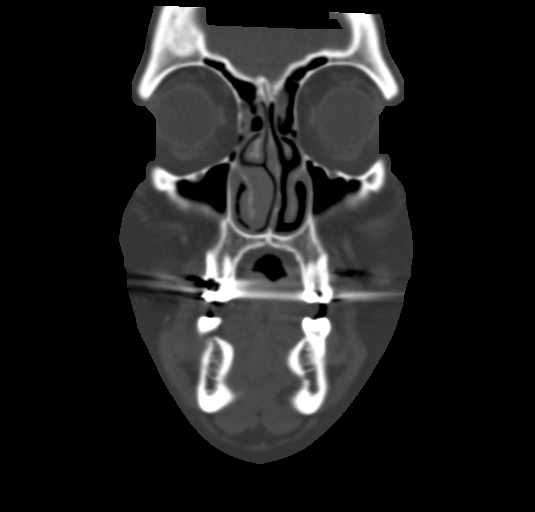
[im 36/81  bone]
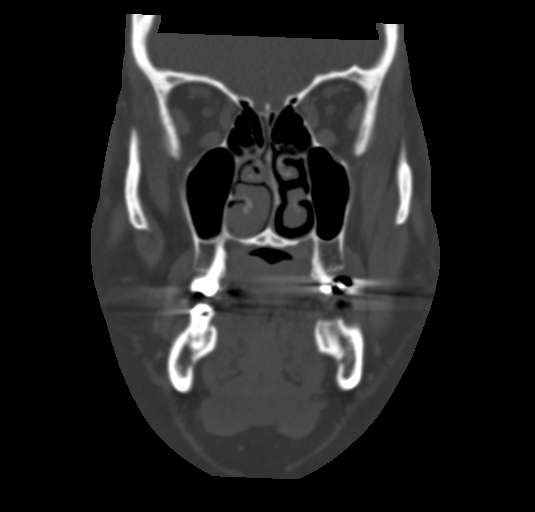
[im 45/81  bone]
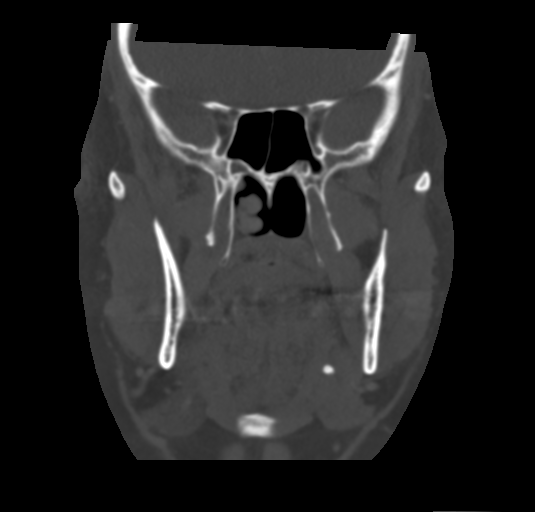

[Series 7: sagittal soft · sagittal · 0.31mm/px · 3 of 94 slices shown]
[im 32/94  bone]
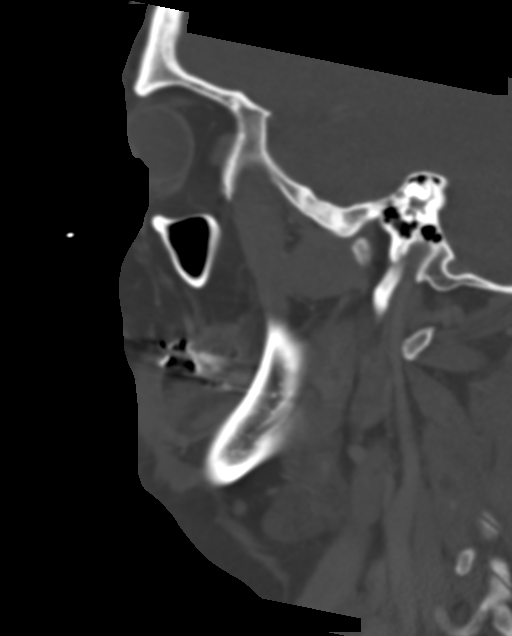
[im 47/94  bone]
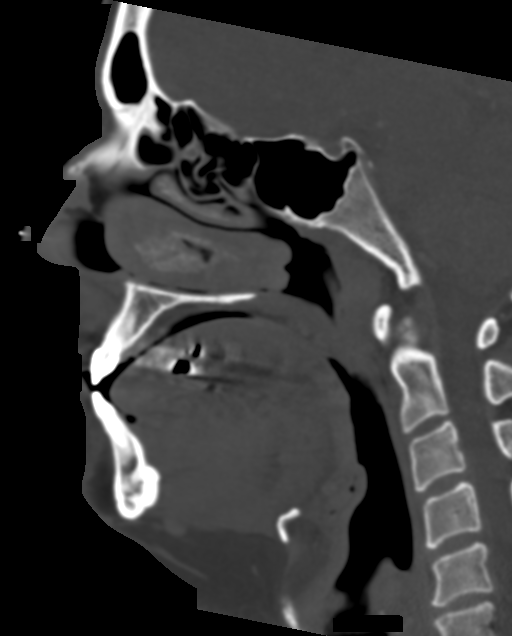
[im 63/94  bone]
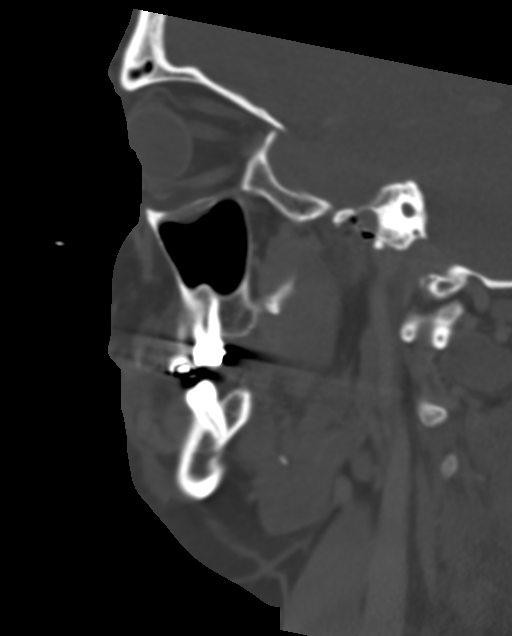

[15 of 47 positions shown; findings below may reference images not displayed]

FINDINGS: Osseous: No acute fracture or mandibular dislocation. No destructive
process. Unchanged appearance of previous nasal bone fracture.

Orbits: Negative. No traumatic or inflammatory finding.

Sinuses: Clear.

Soft tissues: In the soft tissues overlying the right zygomatic
arch, there is a subcutaneous, low-density, peripherally enhancing
lesion that measures up to 2.5 x 1.7 x 2.2 cm (series 2, image 63
and series 6, image 30), which was previously on the 10/14/2015 exam
where it measured 1.3 x 0.9 x 1.1 cm. Presently there is mild
superficial skin thickening with minimal surrounding fat stranding.
No deep structures are involved.

Limited intracranial: No significant or unexpected finding.
IMPRESSION: 1. 2.5 cm subcutaneous lesion overlying the right zygomatic arch,
most likely an inflamed epidermal inclusion cyst, which was present
on the 0742 study although it has increased in size.

2. No acute facial bone fracture or destructive osseous lesion.

## 2023-12-27 ENCOUNTER — Ambulatory Visit: Payer: Self-pay

## 2023-12-27 NOTE — Telephone Encounter (Signed)
 FYI Only or Action Required?: FYI only for provider.  Patient was last seen in primary care on 2012.  Called Nurse Triage reporting Chest Pain.  Symptoms began several years ago.  Interventions attempted: Rest, hydration, or home remedies.  Symptoms are: gradually worsening.  Triage Disposition: See Physician Within 24 Hours  Patient/caregiver understands and will follow disposition?:  Reason for Disposition  [1] Chest pain lasts < 5 minutes AND [2] NO chest pain or cardiac symptoms (e.g., breathing difficulty, sweating) now  (Exception: Chest pains that last only a few seconds.)  Answer Assessment - Initial Assessment Questions Patient reports chest pain several years. Has become worse x 2 weeks, described as a sharp 2/10 pain when turning that resolves at rest. Location varies by movement. Occurs every day to every other day.   1. LOCATION: Where does it hurt?       Moves around  2. RADIATION: Does the pain go anywhere else? (e.g., into neck, jaw, arms, back)     Denies  3. ONSET: When did the chest pain begin? (Minutes, hours or days)      Several years ago  4. PATTERN: Does the pain come and go, or has it been constant since it started?  Does it get worse with exertion?      Comes and goes  5. DURATION: How long does it last (e.g., seconds, minutes, hours)     Brief until straightens out  6. SEVERITY: How bad is the pain?  (e.g., Scale 1-10; mild, moderate, or severe)     2/10  7. CARDIAC RISK FACTORS: Do you have any history of heart problems or risk factors for heart disease? (e.g., angina, prior heart attack; diabetes, high blood pressure, high cholesterol, smoker, or strong family history of heart disease)     Previous smoker, but has not seen PCP since 2012  8. PULMONARY RISK FACTORS: Do you have any history of lung disease?  (e.g., blood clots in lung, asthma, emphysema, birth control pills)     Denies  9. CAUSE: What do you think is causing  the chest pain?     Unknown  Protocols used: Chest Pain-A-AH  Reason for Disposition  [1] Chest pain lasts < 5 minutes AND [2] NO chest pain or cardiac symptoms (e.g., breathing difficulty, sweating) now  (Exception: Chest pains that last only a few seconds.)  Answer Assessment - Initial Assessment Questions Patient reports chest pain several years. Has become worse x 2 weeks, described as a sharp 2/10 pain when turning that resolves at rest. Location varies by movement. Occurs every day to every other day.   Patient will go to [facility] or ED today and has scheduled a future appt to est care at [facility]. ED precautions reviewed, pt verbalized understanding.   1. LOCATION: Where does it hurt?       Moves around  2. RADIATION: Does the pain go anywhere else? (e.g., into neck, jaw, arms, back)     Denies  3. ONSET: When did the chest pain begin? (Minutes, hours or days)      Several years ago  4. PATTERN: Does the pain come and go, or has it been constant since it started?  Does it get worse with exertion?      Comes and goes  5. DURATION: How long does it last (e.g., seconds, minutes, hours)     Brief until straightens out  6. SEVERITY: How bad is the pain?  (e.g., Scale 1-10; mild, moderate, or  severe)     2/10  7. CARDIAC RISK FACTORS: Do you have any history of heart problems or risk factors for heart disease? (e.g., angina, prior heart attack; diabetes, high blood pressure, high cholesterol, smoker, or strong family history of heart disease)     Previous smoker, but has not seen PCP since 2012  8. PULMONARY RISK FACTORS: Do you have any history of lung disease?  (e.g., blood clots in lung, asthma, emphysema, birth control pills)     Denies  9. CAUSE: What do you think is causing the chest pain?     Unknown  Protocols used: Chest Pain-A-AH Copied from CRM [id number]. Topic: Clinical - Red Word Triage >> Dec 27, 2023  2:33 PM Darshell M  wrote: Red Word that prompted transfer to Nurse Triage: Chest for past couple of months.

## 2024-01-02 ENCOUNTER — Ambulatory Visit (HOSPITAL_BASED_OUTPATIENT_CLINIC_OR_DEPARTMENT_OTHER)
Admission: EM | Admit: 2024-01-02 | Discharge: 2024-01-02 | Disposition: A | Discharge status: Home / Self Care | Attending: Family Medicine | Admitting: Family Medicine

## 2024-01-02 ENCOUNTER — Encounter (HOSPITAL_BASED_OUTPATIENT_CLINIC_OR_DEPARTMENT_OTHER): Payer: Self-pay

## 2024-01-02 ENCOUNTER — Ambulatory Visit (HOSPITAL_BASED_OUTPATIENT_CLINIC_OR_DEPARTMENT_OTHER): Admit: 2024-01-02 | Discharge: 2024-01-02 | Disposition: A | Admitting: Radiology

## 2024-01-02 DIAGNOSIS — R079 Chest pain, unspecified: Secondary | ICD-10-CM

## 2024-01-02 MED ORDER — AMOXICILLIN 500 MG PO CAPS: 500.0000 mg | ORAL_CAPSULE | Freq: Three times a day (TID) | ORAL | 0 refills | Status: DC

## 2024-01-02 NOTE — ED Triage Notes (Signed)
 Presents for eval of upper mid chest pain with radiation to back x 2 weeks. Pain is mild at present. Intermittent when it occurs. Exertional SOB at times. States has had GERD in past. Not currently on medication. Patient discussed with pcp office on 10/11 and encouraged to come to [facility] or ED. Patient did not seek medical care at that time. Discussed limitations for urgent care and patient ok with assessement here to start.

## 2024-01-02 NOTE — Discharge Instructions (Addendum)
 I am not seeing anything concerning on your EKG. Your xray showed an opacity which could represent pneumonia.  It was recommended to do therapy for this which would be antibiotics and to have a repeat chest x-ray done in 3 to 4 weeks to see if this has resolved.  If this is pneumonia the chest pain should resolve with this. If not I would recommend starting on Pepcid  once or twice daily in the morning and evening.  Take before a meal about 30 minutes with glass of water.  If your chest pain worsens or you start developing other symptoms to include being lightheaded, dizzy, nausea, sweaty you will need to go to the ER. Otherwise I would follow-up with primary care for further workup and potential referral to cardiology

## 2024-01-02 NOTE — ED Provider Notes (Signed)
 [facility]    CSN: [id number] Arrival date & time: 01/02/24  1003      History   Chief Complaint Chief Complaint  Patient presents with   Chest Pain   Generalized Body Aches    HPI Ricky Robertson is a 38 y.o. male.   Pt is a 38 year old male that presents for eval of upper mid chest pain with radiation to back x 2 weeks. Pain is mild at present. Intermittent when it occurs. Exertional SOB at times. States has had GERD in past. Not currently on medication. Patient discussed with pcp office on 10/11 and encouraged to come to [facility] or ED. Patient did not seek medical care at that time. Family hx of heart disease.    Chest Pain   Past Medical History:  Diagnosis Date   Heart murmur     There are no active problems to display for this patient.   Past Surgical History:  Procedure Laterality Date   CIRCUMCISION     HERNIA REPAIR         Home Medications    Prior to Admission medications   Medication Sig Start Date End Date Taking? Authorizing Provider  amoxicillin (AMOXIL) 500 MG capsule Take 1 capsule (500 mg total) by mouth 3 (three) times daily. 01/02/24  Yes Adah Wilbert LABOR, FNP    Family History History reviewed. No pertinent family history.  Social History Social History   Tobacco Use   Smoking status: Some Days   Smokeless tobacco: Current  Substance Use Topics   Alcohol use: Yes    Comment: weekly   Drug use: No     Allergies   Patient has no known allergies.   Review of Systems Review of Systems  Cardiovascular:  Positive for chest pain.     Physical Exam Triage Vital Signs ED Triage Vitals  Encounter Vitals Group     BP 01/02/24 1054 (!) 137/90     Girls Systolic BP Percentile --      Girls Diastolic BP Percentile --      Boys Systolic BP Percentile --      Boys Diastolic BP Percentile --      Pulse Rate 01/02/24 1054 60     Resp 01/02/24 1054 20     Temp 01/02/24 1054 97.6 F (36.4 C)     Temp Source 01/02/24 1054  Oral     SpO2 01/02/24 1054 97 %     Weight --      Height --      Head Circumference --      Peak Flow --      Pain Score 01/02/24 1058 2     Pain Loc --      Pain Education --      Exclude from Growth Chart --    No data found.  Updated Vital Signs BP (!) 137/90 (BP Location: Right Arm)   Pulse 60   Temp 97.6 F (36.4 C) (Oral)   Resp 20   SpO2 97%   Visual Acuity Right Eye Distance:   Left Eye Distance:   Bilateral Distance:    Right Eye Near:   Left Eye Near:    Bilateral Near:     Physical Exam Constitutional:      General: He is not in acute distress.    Appearance: Normal appearance. He is not ill-appearing, toxic-appearing or diaphoretic.  HENT:     Mouth/Throat:     Pharynx: Oropharynx is clear.  Eyes:     Conjunctiva/sclera: Conjunctivae normal.  Cardiovascular:     Rate and Rhythm: Normal rate and regular rhythm.     Pulses: Normal pulses.     Heart sounds: Normal heart sounds.  Pulmonary:     Effort: Pulmonary effort is normal.     Breath sounds: Normal breath sounds.  Musculoskeletal:        General: Normal range of motion.  Skin:    General: Skin is warm and dry.  Neurological:     Mental Status: He is alert.  Psychiatric:        Mood and Affect: Mood normal.      UC Treatments / Results  Labs (all labs ordered are listed, but only abnormal results are displayed) Labs Reviewed - No data to display  EKG   Radiology No results found.  Procedures Procedures (including critical care time)  Medications Ordered in UC Medications - No data to display  Initial Impression / Assessment and Plan / UC Course  I have reviewed the triage vital signs and the nursing notes.  Pertinent labs & imaging results that were available during my care of the patient were reviewed by me and considered in my medical decision making (see chart for details).     Chest pain/back pain/SOB- No concerns on the EKG today. No acute ST changes  xray showed  an opacity which could represent pneumonia.  It was recommended to do therapy for this which would be antibiotics and to have a repeat chest x-ray done in 3 to 4 weeks to see if this has resolved.  If this is pneumonia the chest pain should resolve with this. If not I would recommend starting on Pepcid  once or twice daily in the morning and evening.  Take before a meal about 30 minutes with glass of water.  If your chest pain worsens or you start developing other symptoms to include being lightheaded, dizzy, nausea, sweaty you will need to go to the ER. Final Clinical Impressions(s) / UC Diagnoses   Final diagnoses:  Chest pain, unspecified type     Discharge Instructions      I am not seeing anything concerning on your EKG. Your xray showed an opacity which could represent pneumonia.  It was recommended to do therapy for this which would be antibiotics and to have a repeat chest x-ray done in 3 to 4 weeks to see if this has resolved.  If this is pneumonia the chest pain should resolve with this. If not I would recommend starting on Pepcid  once or twice daily in the morning and evening.  Take before a meal about 30 minutes with glass of water.  If your chest pain worsens or you start developing other symptoms to include being lightheaded, dizzy, nausea, sweaty you will need to go to the ER. Otherwise I would follow-up with primary care for further workup and potential referral to cardiology     ED Prescriptions     Medication Sig Dispense Auth. Provider   amoxicillin (AMOXIL) 500 MG capsule Take 1 capsule (500 mg total) by mouth 3 (three) times daily. 21 capsule Adah Wilbert LABOR, FNP      PDMP not reviewed this encounter.   Adah Wilbert LABOR, FNP 01/05/24 [phone]

## 2024-01-06 ENCOUNTER — Ambulatory Visit (INDEPENDENT_AMBULATORY_CARE_PROVIDER_SITE_OTHER): Admitting: Student

## 2024-01-06 ENCOUNTER — Encounter (HOSPITAL_BASED_OUTPATIENT_CLINIC_OR_DEPARTMENT_OTHER): Payer: Self-pay | Admitting: Student

## 2024-01-06 VITALS — BP 119/79 | HR 82 | Temp 97.8°F | Resp 16 | Ht 69.88 in | Wt 251.3 lb

## 2024-01-06 DIAGNOSIS — Z7689 Persons encountering health services in other specified circumstances: Secondary | ICD-10-CM

## 2024-01-06 DIAGNOSIS — Z136 Encounter for screening for cardiovascular disorders: Secondary | ICD-10-CM

## 2024-01-06 DIAGNOSIS — R079 Chest pain, unspecified: Secondary | ICD-10-CM

## 2024-01-06 DIAGNOSIS — Z131 Encounter for screening for diabetes mellitus: Secondary | ICD-10-CM | POA: Diagnosis not present

## 2024-01-06 DIAGNOSIS — J189 Pneumonia, unspecified organism: Secondary | ICD-10-CM | POA: Diagnosis not present

## 2024-01-06 DIAGNOSIS — R222 Localized swelling, mass and lump, trunk: Secondary | ICD-10-CM

## 2024-01-06 DIAGNOSIS — Z13220 Encounter for screening for lipoid disorders: Secondary | ICD-10-CM

## 2024-01-06 DIAGNOSIS — E66812 Obesity, class 2: Secondary | ICD-10-CM | POA: Insufficient documentation

## 2024-01-06 DIAGNOSIS — Z6836 Body mass index (BMI) 36.0-36.9, adult: Secondary | ICD-10-CM | POA: Insufficient documentation

## 2024-01-06 DIAGNOSIS — R200 Anesthesia of skin: Secondary | ICD-10-CM

## 2024-01-06 NOTE — Progress Notes (Signed)
 New Patient Office Visit  Subjective    Patient ID: Ricky Robertson, male    DOB: [birth year]  Age: 38 y.o. MRN: [id number]  CC:  Chief Complaint  Patient presents with   Establish Care    Here to establish care.   Chest Pain    Has been having chest pains over 1 year. Saw UC Sunday. Had X-ray and EKG done and was told he had walking PNA. Has not had PCP since 2012.   Numbness    Toes have been going numb for about 5-6 months. Concerned of diabetes. Has ate today. Feels like he has a hair wrapped around toe.     Discussed the use of AI scribe software for clinical note transcription with the patient, who gave verbal consent to proceed.  History of Present Illness   Ricky Robertson is a 38 year old male who presents for establishment of care with history of chest pain and numbness in the toes.  He has experienced intermittent chest pain for over a year, with an increase in frequency to daily occurrences over the past two weeks. The pain worsened last week, prompting him to seek medical attention. A chest x-ray performed at urgent care showed a spot on his lung, and he was told he had walking pneumonia. He was prescribed amoxicillin, and he reports that he has not had chest pain since Monday. No current chest pain, and it was not exertionally related, nor associated with meals or alcohol consumption. He has a family history of heart disease, with his father having had two heart attacks in his late forties to early fifties.  He reports numbness in his toes for the past five to six months, primarily affecting the left foot. The numbness occurs randomly and is not associated with specific activities or times of day. He works two jobs, which involve being on his feet for extended periods. He is concerned about the possibility of diabetes, although he has not been diagnosed with it.  He has a history of smoking, having quit in December of the previous year. He smoked a pack to two packs per day and  has a history of alcohol consumption, which he has significantly reduced over the past three years. He experiences shortness of breath with exertion, which he attributes to his smoking history. No current shortness of breath, except during the recent pneumonia episode.  He recalls having acid reflux two years ago, which lasted about a month and has not recurred. No current symptoms of reflux, such as nighttime cough, regurgitation, or sour taste in the mouth.       Outpatient Encounter Medications as of 01/06/2024  Medication Sig   amoxicillin (AMOXIL) 500 MG capsule Take 1 capsule (500 mg total) by mouth 3 (three) times daily.   No facility-administered encounter medications on file as of 01/06/2024.    Past Medical History:  Diagnosis Date   Heart murmur     Past Surgical History:  Procedure Laterality Date   CIRCUMCISION     HERNIA REPAIR      Family History  Problem Relation Age of Onset   Healthy Mother    Heart attack Father        x2   Sleep apnea Father    Obesity Sister     Social History   Socioeconomic History   Marital status: Married    Spouse name: Not on file   Number of children: 2   Years of education: Not on  file   Highest education level: Not on file  Occupational History   Not on file  Tobacco Use   Smoking status: Former    Current packs/day: 0.00    Average packs/day: 1 pack/day for 19.0 years (19.0 ttl pk-yrs)    Types: Cigarettes    Start date: 2006    Quit date: 03/12/2023    Years since quitting: 0.8   Smokeless tobacco: Never   Tobacco comments:    1-2 packs since age 89, quit 03/12/2023. Did quit a year and would restart.   Vaping Use   Vaping status: Never Used  Substance and Sexual Activity   Alcohol use: Yes    Comment: 3-4 x a year maybe   Drug use: No   Sexual activity: Yes    Birth control/protection: None  Other Topics Concern   Not on file  Social History Narrative   Not on file   Social Drivers of Health    Financial Resource Strain: Not on file  Food Insecurity: No Food Insecurity (01/06/2024)   Hunger Vital Sign    Worried About Running Out of Food in the Last Year: Never true    Ran Out of Food in the Last Year: Never true  Transportation Needs: No Transportation Needs (01/06/2024)   PRAPARE - Administrator, Civil Service (Medical): No    Lack of Transportation (Non-Medical): No  Physical Activity: Not on file  Stress: Not on file  Social Connections: Not on file  Intimate Partner Violence: Not At Risk (01/06/2024)   Humiliation, Afraid, Rape, and Kick questionnaire    Fear of Current or Ex-Partner: No    Emotionally Abused: No    Physically Abused: No    Sexually Abused: No    ROS  Per HPI      Objective    BP 119/79   Pulse 82   Temp 97.8 F (36.6 C) (Oral)   Resp 16   Ht 5' 9.88 (1.775 m)   Wt 251 lb 4.8 oz (114 kg)   SpO2 96%   BMI 36.18 kg/m   Physical Exam Constitutional:      General: He is not in acute distress.    Appearance: Normal appearance. He is not ill-appearing.  HENT:     Head: Normocephalic and atraumatic.     Right Ear: External ear normal.     Left Ear: External ear normal.     Nose: Nose normal.     Mouth/Throat:     Mouth: Mucous membranes are moist.     Pharynx: Oropharynx is clear. No oropharyngeal exudate or posterior oropharyngeal erythema.  Eyes:     General: No scleral icterus.    Extraocular Movements: Extraocular movements intact.     Conjunctiva/sclera: Conjunctivae normal.     Pupils: Pupils are equal, round, and reactive to light.  Neck:     Vascular: No carotid bruit.  Cardiovascular:     Rate and Rhythm: Normal rate and regular rhythm.     Pulses: Normal pulses.     Heart sounds: Normal heart sounds. No murmur heard.    No friction rub.  Pulmonary:     Effort: Pulmonary effort is normal. No respiratory distress.     Breath sounds: Normal breath sounds. No wheezing, rhonchi or rales.   Musculoskeletal:     Right lower leg: No edema.     Left lower leg: No edema.  Skin:    General: Skin is warm and dry.  Coloration: Skin is not jaundiced or pale.  Neurological:     Mental Status: He is alert.  Psychiatric:        Mood and Affect: Mood normal.        Behavior: Behavior normal.     Last CBC Lab Results  Component Value Date   WBC 6.6 01/06/2022   HGB 13.9 01/06/2022   HCT 40.7 01/06/2022   MCV 88.3 01/06/2022   MCH 30.2 01/06/2022   RDW 13.6 01/06/2022   PLT 169 01/06/2022   Last metabolic panel Lab Results  Component Value Date   GLUCOSE 150 (H) 01/06/2022   NA 139 01/06/2022   K 3.4 (L) 01/06/2022   CL 105 01/06/2022   CO2 24 01/06/2022   BUN 16 01/06/2022   CREATININE 1.11 01/06/2022   GFRNONAA >60 01/06/2022   CALCIUM 9.3 01/06/2022   PROT 7.0 01/06/2022   ALBUMIN 4.3 01/06/2022   BILITOT 0.7 01/06/2022   ALKPHOS 79 01/06/2022   AST 21 01/06/2022   ALT 30 01/06/2022   ANIONGAP 10 01/06/2022   Last lipids No results found for: CHOL, HDL, LDLCALC, LDLDIRECT, TRIG, CHOLHDL Last hemoglobin A1c No results found for: HGBA1C      Assessment & Plan:   Assessment and Plan    Establishment of Care  Chronic Chest pain and exertional shortness of breath Intermittent chest pain for over a year, worsening in the last two weeks, now resolved. Exertional shortness of breath persists, likely related to recent pneumonia and smoking history. No current chest pain, shortness of breath, nausea, or jaw pain. Family history of heart disease. Differential includes cardiac etiology and reflux-related chest pain. - Continue amoxicillin for pneumonia. - Will consider stronger antibiotic if symptoms persist. - Will consider ordering treadmill echocardiogram if exertional shortness of breath persists after pneumonia resolution. Length of issue suggests chronic versus acute issue aside from exacerbation due to PNA. - ER precautions reviewed. -  Consider over-the-counter Pepcid  for potential reflux-related chest pain. - Will order coronary calcium score and stress echocardiogram after pneumonia resolution.  Recent pneumonia Diagnosed with pneumonia via chest x-ray, treated with amoxicillin. Symptoms have improved since starting antibiotics. - Continue amoxicillin as prescribed. - Ordered follow-up chest x-ray in four weeks to assess resolution of pneumonia.  Numbness of left toes Intermittent numbness in left toes for 5-6 months, not associated with specific activities or times of day. 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 - Checked A1c to rule out diabetes. - Monitor symptoms and reassess if numbness persists or worsens.  Fatty cyst (possible lipoma) of abdominal wall Fatty cyst present since 2016, occasionally sore. No change in size. Differential includes lipoma or hernia. - Offered ultrasound of abdominal cyst if desired in the future.     Return in about 8 weeks (around 03/02/2024) for Annual Physical.   Keenon Leitzel T Mehdi Gironda, PA-C

## 2024-01-06 NOTE — Patient Instructions (Signed)
 It was nice to see you today!  If you have any problems before your next visit feel free to message me via MyChart (minor issues or questions) or call the office, otherwise you may reach out to schedule an office visit.  Thank you! Pau Banh, PA-C

## 2024-01-07 DIAGNOSIS — R200 Anesthesia of skin: Secondary | ICD-10-CM | POA: Insufficient documentation

## 2024-02-08 ENCOUNTER — Ambulatory Visit: Payer: Self-pay | Admitting: Family Medicine

## 2024-03-03 ENCOUNTER — Encounter (HOSPITAL_BASED_OUTPATIENT_CLINIC_OR_DEPARTMENT_OTHER): Payer: Self-pay | Admitting: Student

## 2024-03-03 ENCOUNTER — Ambulatory Visit (INDEPENDENT_AMBULATORY_CARE_PROVIDER_SITE_OTHER): Admitting: Student

## 2024-03-03 VITALS — BP 120/83 | HR 85 | Temp 97.7°F | Resp 16 | Ht 70.28 in | Wt 249.9 lb

## 2024-03-03 DIAGNOSIS — Z13220 Encounter for screening for lipoid disorders: Secondary | ICD-10-CM

## 2024-03-03 DIAGNOSIS — Z131 Encounter for screening for diabetes mellitus: Secondary | ICD-10-CM

## 2024-03-03 DIAGNOSIS — Z136 Encounter for screening for cardiovascular disorders: Secondary | ICD-10-CM | POA: Diagnosis not present

## 2024-03-03 DIAGNOSIS — Z6835 Body mass index (BMI) 35.0-35.9, adult: Secondary | ICD-10-CM

## 2024-03-03 DIAGNOSIS — Z72820 Sleep deprivation: Secondary | ICD-10-CM | POA: Insufficient documentation

## 2024-03-03 DIAGNOSIS — L720 Epidermal cyst: Secondary | ICD-10-CM | POA: Diagnosis not present

## 2024-03-03 DIAGNOSIS — Z0000 Encounter for general adult medical examination without abnormal findings: Secondary | ICD-10-CM | POA: Diagnosis not present

## 2024-03-03 LAB — CBC WITH DIFFERENTIAL/PLATELET
Basophils Absolute: 0.1 x10E3/uL (ref 0.0–0.2)
Basos: 1 %
EOS (ABSOLUTE): 0.2 x10E3/uL (ref 0.0–0.4)
Eos: 3 %
Hematocrit: 43.7 % (ref 37.5–51.0)
Hemoglobin: 15.1 g/dL (ref 13.0–17.7)
Immature Grans (Abs): 0 x10E3/uL (ref 0.0–0.1)
Immature Granulocytes: 0 %
Lymphocytes Absolute: 2.7 x10E3/uL (ref 0.7–3.1)
Lymphs: 31 %
MCH: 31.1 pg (ref 26.6–33.0)
MCHC: 34.6 g/dL (ref 31.5–35.7)
MCV: 90 fL (ref 79–97)
Monocytes Absolute: 0.9 x10E3/uL (ref 0.1–0.9)
Monocytes: 11 %
Neutrophils Absolute: 4.8 x10E3/uL (ref 1.4–7.0)
Neutrophils: 54 %
Platelets: 238 x10E3/uL (ref 150–450)
RBC: 4.85 x10E6/uL (ref 4.14–5.80)
RDW: 12.4 % (ref 11.6–15.4)
WBC: 8.7 x10E3/uL (ref 3.4–10.8)

## 2024-03-03 LAB — LIPID PANEL
Chol/HDL Ratio: 3.7 ratio (ref 0.0–5.0)
Cholesterol, Total: 158 mg/dL (ref 100–199)
HDL: 43 mg/dL (ref >39)
LDL Chol Calc (NIH): 100 mg/dL — ABNORMAL HIGH (ref 0–99)
Triglycerides: 79 mg/dL (ref 0–149)
VLDL Cholesterol Cal: 15 mg/dL (ref 5–40)

## 2024-03-03 LAB — COMPREHENSIVE METABOLIC PANEL WITH GFR
ALT: 30 IU/L (ref 0–44)
AST: 25 IU/L (ref 0–40)
Albumin: 4.5 g/dL (ref 4.1–5.1)
Alkaline Phosphatase: 90 IU/L (ref 47–123)
BUN/Creatinine Ratio: 14 (ref 9–20)
BUN: 17 mg/dL (ref 6–20)
Bilirubin Total: 0.9 mg/dL (ref 0.0–1.2)
CO2: 23 mmol/L (ref 20–29)
Calcium: 9.5 mg/dL (ref 8.7–10.2)
Chloride: 103 mmol/L (ref 96–106)
Creatinine, Ser: 1.23 mg/dL (ref 0.76–1.27)
Globulin, Total: 2.4 g/dL (ref 1.5–4.5)
Glucose: 87 mg/dL (ref 70–99)
Potassium: 4.2 mmol/L (ref 3.5–5.2)
Sodium: 141 mmol/L (ref 134–144)
Total Protein: 6.9 g/dL (ref 6.0–8.5)
eGFR: 77 mL/min/1.73 (ref >59)

## 2024-03-03 LAB — HEMOGLOBIN A1C
Est. average glucose Bld gHb Est-mCnc: 108 mg/dL
Hgb A1c MFr Bld: 5.4 % (ref 4.8–5.6)

## 2024-03-03 NOTE — Patient Instructions (Addendum)
 It was nice to see you today!  [facility] Address: [address] [place], [place], [place] [address] Phone: [phone]  Things to do to keep yourself healthy! - Exercise at least 30-45 minutes a day, 3-4 days a week.  - Eat a low-fat diet with lots of fruits and vegetables, up to 7-9 servings per day.  - Seatbelts can save your life. Wear them always.  - Smoke detectors on every level of your home, check batteries every year.  - Eye Doctor: have an eye exam every 1-2 years, even if you do not wear glasses or contacts. - Safe sex: if you may be exposed to STDs, use a condom.  - Alcohol: If you drink, do it moderately, less than 2 drinks per day.  - Health Care Power of Attorney: choose someone to speak for you if you are not able.  - Depression and anxiety are common in our stressful world.If you're feeling stressed, down, or like you're losing interest in things you normally enjoy, please come in for a visit.  - Violence: If anyone is threatening or hurting you, please call immediately.  Everyone deserves to be safe and loved in all of the relationships.   Please make sure to get you Tetanus vaccine done at a pharmacy.  If you have any problems before your next visit feel free to message me via MyChart (minor issues or questions) or call the office, otherwise you may reach out to schedule an office visit.  Thank you! Klint Lezcano, PA-C

## 2024-03-03 NOTE — Progress Notes (Signed)
 Complete physical exam  Patient: Ricky Robertson   DOB: Sep 25, 1985   38 y.o. Male  MRN: [id number]  Subjective:    Chief Complaint  Patient presents with   Annual Exam    Annual exam.   Medical Management of Chronic Issues    Follow up. Still has some chest pain. Feels tight still sometimes during certain movements. Numbness of toes has happened maybe once or twice since then.    Discussed the use of AI scribe software for clinical note transcription with the patient, who gave verbal consent to proceed.  History of Present Illness   Ricky Robertson is a 38 year old male who presents for an annual physical exam and follow-up on chest issues and numbness in toes.  He has experienced chest tightness twice since his last appointment. The sensation is brief and occurs with certain movements, possibly related to posture. No associated exertional symptoms, radiation to the arm or jaw, or shortness of breath.  He mentions numbness in his toes, which has significantly decreased in frequency. Previously, it occurred two to three times a week but has now subsided. He attributes the improvement to possibly new shoes. He works two jobs and is on his feet a lot, often on concrete surfaces.  He has a small bump on his face, resembling a pimple, which has been present for a couple of months. He has tried to pop it without success. He also has cysts on his face and back, which have been present for years and have been lanced in the past but tend to recur.  His diet includes more fast food due to his busy schedule, with options like [employer] and [employer]. He finds it challenging to make dietary changes due to his current lifestyle. He drinks about 24 to 40 ounces of water daily and recognizes the need to increase his intake.  He reports sleeping well when he does sleep, but his schedule often results in fragmented sleep patterns, with some nights only getting three hours of sleep. He works two jobs, one during  the day and another at night, affecting his sleep consistency.  He has not had any recent eye exams but notes a slight decline in vision. He follows up with a dentist regularly and reports no current dental issues. No genital urinary complaints.      Most recent fall risk assessment:     No data to display           Most recent depression screenings:    01/06/2024    2:08 PM  PHQ 2/9 Scores  PHQ - 2 Score 0  PHQ- 9 Score 2      Data saved with a previous flowsheet row definition   Patient Active Problem List   Diagnosis Date Noted   Sleep deprivation 03/03/2024   Numbness of toes 01/07/2024   Abdominal wall lump 01/06/2024   Chest pain 01/06/2024   Pneumonia of lower lobe due to infectious organism 01/06/2024   BMI 36.0-36.9,adult 01/06/2024   Class 2 obesity due to excess calories without serious comorbidity with body mass index (BMI) of 36.0 to 36.9 in adult 01/06/2024   Past Medical History:  Diagnosis Date   Heart murmur    Social History[1] Allergies[2]    Patient Care Team: Steven Veazie T, PA-C as PCP - General (Physician Assistant)   Show/hide medication list[3]  ROS  Per HPI     Objective:     BP 120/83   Pulse 85  Temp 97.7 F (36.5 C) (Oral)   Resp 16   Ht 5' 10.28 (1.785 m)   Wt 249 lb 14.4 oz (113.4 kg)   SpO2 96%   BMI 35.58 kg/m  BP Readings from Last 3 Encounters:  03/03/24 120/83  01/06/24 119/79  01/02/24 (!) 137/90   Wt Readings from Last 3 Encounters:  03/03/24 249 lb 14.4 oz (113.4 kg)  01/06/24 251 lb 4.8 oz (114 kg)  01/06/22 245 lb (111.1 kg)   SpO2 Readings from Last 3 Encounters:  03/03/24 96%  01/06/24 96%  01/02/24 97%      Physical Exam Constitutional:      General: He is not in acute distress.    Appearance: Normal appearance. He is not ill-appearing or diaphoretic.  HENT:     Head: Normocephalic and atraumatic.     Right Ear: Tympanic membrane, ear canal and external ear normal.     Left Ear:  Tympanic membrane, ear canal and external ear normal.     Nose: Nose normal.     Mouth/Throat:     Mouth: Mucous membranes are moist.     Pharynx: Oropharynx is clear.  Eyes:     General: No scleral icterus.       Right eye: No discharge.        Left eye: No discharge.     Extraocular Movements: Extraocular movements intact.     Conjunctiva/sclera: Conjunctivae normal.     Pupils: Pupils are equal, round, and reactive to light.  Neck:     Thyroid: No thyroid mass, thyromegaly or thyroid tenderness.     Vascular: No carotid bruit.  Cardiovascular:     Rate and Rhythm: Normal rate and regular rhythm.     Pulses: Normal pulses.     Heart sounds: Normal heart sounds. No murmur heard.    No friction rub. No gallop.  Pulmonary:     Effort: Pulmonary effort is normal.     Breath sounds: Normal breath sounds. No wheezing, rhonchi or rales.  Chest:     Chest wall: No tenderness.  Abdominal:     General: Bowel sounds are normal. There is no distension.     Palpations: Abdomen is soft.     Tenderness: There is no abdominal tenderness. There is no guarding.  Musculoskeletal:        General: No swelling, deformity or signs of injury.     Cervical back: Neck supple.     Right lower leg: No edema.     Left lower leg: No edema.  Lymphadenopathy:     Cervical: No cervical adenopathy.     Right cervical: No superficial or posterior cervical adenopathy.    Left cervical: No superficial cervical adenopathy.  Skin:    Coloration: Skin is not jaundiced.     Findings: No rash.  Neurological:     General: No focal deficit present.     Mental Status: He is alert and oriented to person, place, and time.     Motor: No weakness.  Psychiatric:        Behavior: Behavior normal.      No results found for any visits on 03/03/24. Last CBC Lab Results  Component Value Date   WBC 6.6 01/06/2022   HGB 13.9 01/06/2022   HCT 40.7 01/06/2022   MCV 88.3 01/06/2022   MCH 30.2 01/06/2022   RDW 13.6  01/06/2022   PLT 169 01/06/2022   Last metabolic panel Lab Results  Component Value Date  GLUCOSE 150 (H) 01/06/2022   NA 139 01/06/2022   K 3.4 (L) 01/06/2022   CL 105 01/06/2022   CO2 24 01/06/2022   BUN 16 01/06/2022   CREATININE 1.11 01/06/2022   GFRNONAA >60 01/06/2022   CALCIUM 9.3 01/06/2022   PROT 7.0 01/06/2022   ALBUMIN 4.3 01/06/2022   BILITOT 0.7 01/06/2022   ALKPHOS 79 01/06/2022   AST 21 01/06/2022   ALT 30 01/06/2022   ANIONGAP 10 01/06/2022   Last lipids No results found for: CHOL, HDL, LDLCALC, LDLDIRECT, TRIG, CHOLHDL Last hemoglobin A1c No results found for: HGBA1C      Assessment & Plan:    Routine Health Maintenance and Physical Exam  Health Maintenance  Topic Date Due   Hepatitis C Screening  Never done   COVID-19 Vaccine (1 - 2025-26 season) 03/19/2024*   Flu Shot  06/20/2024*   DTaP/Tdap/Td vaccine (3 - Tdap) 03/03/2025*   HPV Vaccine (1 - 3-dose SCDM series) 03/03/2025*   Hepatitis B Vaccine  Completed   HIV Screening  Completed   Pneumococcal Vaccine  Aged Out   Meningitis B Vaccine  Aged Out  *Topic was postponed. The date shown is not the original due date.    Assessment and Plan    Annual Exam Routine health maintenance discussed, including vaccinations and screenings. Tetanus vaccination is overdue, and Hepatitis C screening is due. - Recommended tetanus vaccination, especially due to occupational exposure to metal. - Advised Hepatitis C screening as part of routine health maintenance.  Obesity Weight gain likely due to dietary habits and lack of exercise. Current diet includes frequent fast food consumption, particularly high-carbohydrate and high-fat options. Physical activity is limited to work-related walking and lifting. Sleep deprivation due to shift work may contribute to [employer] challenges. - Encouraged dietary modifications, including reducing carbohydrate intake and increasing protein. - Advised  increasing water intake to 64 ounces per day to aid in weight management. - Discussed potential benefits of meal prepping and healthier fast food choices. - Encouraged continued physical activity through work-related tasks.  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 Previous drainage attempts have been unsuccessful in preventing recurrence. - Referred to dermatology for cyst drainage and potential removal.  Periorbital milia Benign periorbital milia present for several months. No associated symptoms or complications. - Recommended using abrasive facial cleaners daily for a few weeks to attempt removal. - Advised against popping the milia. - Suggested dermatology referral if no improvement.  Sleep deprivation due to shift work Sleep deprivation due to irregular work schedule, including night shifts and insufficient sleep duration. Reports sleeping only 3 hours on some nights and taking naps during the day. Acknowledges the impact of sleep on overall health and weight management. - Discussed the importance of consistent sleep patterns and the potential health risks of prolonged sleep deprivation. - Encouraged consideration of work schedule adjustments to improve sleep quality.      Return in about 1 year (around 03/03/2025), or if symptoms worsen or fail to improve, for Annual Physical.     Lang DASEN Mazzy Santarelli, PA-C      [1]  Social History Tobacco Use   Smoking status: Former    Current packs/day: 0.00    Average packs/day: 1 pack/day for 19.0 years (19.0 ttl pk-yrs)    Types: Cigarettes    Start date: 2006    Quit date: 03/12/2023    Years since  quitting: 0.9    Passive exposure: Past   Smokeless tobacco: Never   Tobacco comments:    1-2 packs since age 60, quit 03/12/2023. Did quit a year and would restart.   Vaping Use   Vaping status: Never Used  Substance Use Topics   Alcohol  use: Yes    Comment: 3-4 x a year maybe   Drug use: No  [2] No Known Allergies [3]  Outpatient Medications Prior to Visit  Medication Sig   [DISCONTINUED] amoxicillin  (AMOXIL ) 500 MG capsule Take 1 capsule (500 mg total) by mouth 3 (three) times daily.   No facility-administered medications prior to visit.

## 2024-03-08 ENCOUNTER — Ambulatory Visit (HOSPITAL_BASED_OUTPATIENT_CLINIC_OR_DEPARTMENT_OTHER): Payer: Self-pay | Admitting: Student

## 2024-04-05 ENCOUNTER — Ambulatory Visit: Payer: Self-pay

## 2024-04-05 NOTE — Telephone Encounter (Signed)
 FYI Only or Action Required?: FYI only for provider: appointment scheduled on 04/21/24.  Patient was last seen in primary care on 03/03/2024 by Rothfuss, Lang DASEN, PA-C.  Called Nurse Triage reporting Hematuria and Abdominal Pain.  Symptoms began about a month ago.  Interventions attempted: Nothing.  Symptoms are: gradually worsening.  Triage Disposition: See PCP Within 2 Weeks  Patient/caregiver understands and will follow disposition?: Yes    Copied from CRM [id number]. Topic: Clinical - Red Word Triage >> Apr 05, 2024  4:34 PM Ricky Robertson wrote: Red Word that prompted transfer to Nurse Triage: Blood in urine .    Reason for Disposition  Abdominal pain is a chronic symptom (recurrent or ongoing AND present > 4 weeks)  Answer Assessment - Initial Assessment Questions Pt called in to report that he had traces of blood in urinalysis today. Pt is asymptomatic, no burning or pain with urination, no visible blood, no frequency of urination, no fever. Pt states he would like to f.u with clinic d/t abdominal pain. He states he has had a knot on LLQ x 10 years but he has been experiencing more pain in area over the last month. No known injury, no interventions at this time. Discussed appt availability and pt scheduled with Dr. Dottie 04/21/24 and placed on wait list. Pt was hoping for appt this Friday or next Wednesday as he works two jobs and has those days off. Pt is available via phone if able to get in sooner, okay to call. Appointment scheduled for evaluation. Patient agrees with plan of care, and will call back if anything changes, or if symptoms worsen.     1. LOCATION: Where does it hurt?      LLQ  2. RADIATION: Does the pain shoot anywhere else? (e.g., chest, back)     No   3. ONSET: When did the pain begin? (Minutes, hours or days ago)      X10 years, worse x 1 month   4. SUDDEN: Gradual or sudden onset?     Gradual   5. PATTERN Does the pain come and go, or is it  constant?     Comes and goes   6. SEVERITY: How bad is the pain?  (e.g., Scale 1-10; mild, moderate, or severe)     Worsening at this time; no rating   7. RECURRENT SYMPTOM: Have you ever had this type of stomach pain before? If Yes, ask: When was the last time? and What happened that time?      No   8. CAUSE: What do you think is causing the stomach pain? (e.g., gallstones, recent abdominal surgery)     Unknown   10. OTHER SYMPTOMS: Do you have any other symptoms? (e.g., back pain, diarrhea, fever, urination pain, vomiting)       No  Protocols used: Abdominal Pain - Male-A-AH

## 2024-04-21 ENCOUNTER — Ambulatory Visit (INDEPENDENT_AMBULATORY_CARE_PROVIDER_SITE_OTHER): Admitting: Family Medicine

## 2024-04-21 ENCOUNTER — Encounter (HOSPITAL_BASED_OUTPATIENT_CLINIC_OR_DEPARTMENT_OTHER): Payer: Self-pay | Admitting: Family Medicine

## 2024-04-21 VITALS — BP 117/76 | HR 58 | Temp 97.5°F | Resp 16 | Ht 70.28 in | Wt 236.3 lb

## 2024-04-21 DIAGNOSIS — R1012 Left upper quadrant pain: Secondary | ICD-10-CM | POA: Diagnosis not present

## 2024-04-21 DIAGNOSIS — D171 Benign lipomatous neoplasm of skin and subcutaneous tissue of trunk: Secondary | ICD-10-CM

## 2024-04-21 DIAGNOSIS — R3121 Asymptomatic microscopic hematuria: Secondary | ICD-10-CM

## 2024-04-21 LAB — POCT URINALYSIS DIP (CLINITEK)
Bilirubin, UA: NEGATIVE
Blood, UA: NEGATIVE
Glucose, UA: NEGATIVE mg/dL
Ketones, POC UA: NEGATIVE mg/dL
Leukocytes, UA: NEGATIVE
Nitrite, UA: NEGATIVE
POC PROTEIN,UA: NEGATIVE
Spec Grav, UA: 1.025
Urobilinogen, UA: 1 U/dL
pH, UA: 6
